# Patient Record
Sex: Female | Born: 1965 | Race: White | Hispanic: No | Marital: Married | Smoking: Never smoker
Health system: Southern US, Community
[De-identification: ages and names within clinical notes are randomized; demographics above are authoritative.]

## PROBLEM LIST (undated history)

## (undated) DIAGNOSIS — K635 Polyp of colon: Secondary | ICD-10-CM

## (undated) DIAGNOSIS — M549 Dorsalgia, unspecified: Secondary | ICD-10-CM

## (undated) DIAGNOSIS — K5792 Diverticulitis of intestine, part unspecified, without perforation or abscess without bleeding: Secondary | ICD-10-CM

## (undated) DIAGNOSIS — F419 Anxiety disorder, unspecified: Secondary | ICD-10-CM

## (undated) DIAGNOSIS — Q7962 Hypermobile Ehlers-Danlos syndrome: Secondary | ICD-10-CM

## (undated) DIAGNOSIS — G43909 Migraine, unspecified, not intractable, without status migrainosus: Secondary | ICD-10-CM

## (undated) DIAGNOSIS — K219 Gastro-esophageal reflux disease without esophagitis: Secondary | ICD-10-CM

## (undated) DIAGNOSIS — I10 Essential (primary) hypertension: Secondary | ICD-10-CM

## (undated) HISTORY — DX: Diverticulitis of intestine, part unspecified, without perforation or abscess without bleeding: K57.92

## (undated) HISTORY — DX: Gastro-esophageal reflux disease without esophagitis: K21.9

## (undated) HISTORY — DX: Polyp of colon: K63.5

---

## 1998-10-23 ENCOUNTER — Other Ambulatory Visit: Admission: RE | Admit: 1998-10-23 | Discharge: 1998-10-23 | Payer: Self-pay | Admitting: Obstetrics & Gynecology

## 2009-09-19 ENCOUNTER — Emergency Department (HOSPITAL_BASED_OUTPATIENT_CLINIC_OR_DEPARTMENT_OTHER): Admission: EM | Admit: 2009-09-19 | Discharge: 2009-09-19 | Payer: Self-pay | Admitting: Emergency Medicine

## 2010-11-05 LAB — POCT CARDIAC MARKERS
CKMB, poc: <1 ng/mL — ABNORMAL LOW (ref 1.0–8.0)
Myoglobin, poc: 25.5 ng/mL (ref 12–200)

## 2010-11-05 LAB — BASIC METABOLIC PANEL
BUN: 16 mg/dL (ref 6–23)
CO2: 31 mEq/L (ref 19–32)
Chloride: 102 mEq/L (ref 96–112)
Creatinine, Ser: 0.7 mg/dL (ref 0.4–1.2)
Glucose, Bld: 87 mg/dL (ref 70–99)

## 2010-11-05 LAB — CBC
HCT: 43.2 % (ref 36.0–46.0)
MCHC: 33.7 g/dL (ref 30.0–36.0)
MCV: 92.2 fL (ref 78.0–100.0)
Platelets: 206 10*3/uL (ref 150–400)
RDW: 12.3 % (ref 11.5–15.5)

## 2010-11-05 LAB — DIFFERENTIAL
Basophils Absolute: 0.1 10*3/uL (ref 0.0–0.1)
Basophils Relative: 1 % (ref 0–1)
Eosinophils Absolute: 0.3 10*3/uL (ref 0.0–0.7)
Eosinophils Relative: 5 % (ref 0–5)
Lymphs Abs: 1.7 10*3/uL (ref 0.7–4.0)

## 2010-11-05 LAB — D-DIMER, QUANTITATIVE

## 2013-09-20 ENCOUNTER — Ambulatory Visit: Payer: No Typology Code available for payment source | Attending: Internal Medicine

## 2013-10-16 ENCOUNTER — Other Ambulatory Visit: Payer: Self-pay

## 2013-10-16 ENCOUNTER — Emergency Department (HOSPITAL_COMMUNITY)
Admission: EM | Admit: 2013-10-16 | Discharge: 2013-10-16 | Disposition: A | Discharge status: Home / Self Care | Payer: No Typology Code available for payment source | Source: Home / Self Care

## 2013-10-16 ENCOUNTER — Encounter (HOSPITAL_COMMUNITY): Payer: Self-pay | Admitting: Emergency Medicine

## 2013-10-16 DIAGNOSIS — G8929 Other chronic pain: Secondary | ICD-10-CM

## 2013-10-16 DIAGNOSIS — I10 Essential (primary) hypertension: Secondary | ICD-10-CM

## 2013-10-16 DIAGNOSIS — Q796 Ehlers-Danlos syndrome, unspecified: Secondary | ICD-10-CM

## 2013-10-16 DIAGNOSIS — F419 Anxiety disorder, unspecified: Secondary | ICD-10-CM

## 2013-10-16 HISTORY — DX: Anxiety disorder, unspecified: F41.9

## 2013-10-16 HISTORY — DX: Dorsalgia, unspecified: M54.9

## 2013-10-16 HISTORY — DX: Hypermobile Ehlers-Danlos syndrome: Q79.62

## 2013-10-16 HISTORY — DX: Migraine, unspecified, not intractable, without status migrainosus: G43.909

## 2013-10-16 HISTORY — DX: Essential (primary) hypertension: I10

## 2013-10-16 MED ORDER — CLONIDINE HCL 0.1 MG PO TABS
0.1000 mg | ORAL_TABLET | Freq: Once | ORAL | Status: AC
  Administered 2013-10-16: 0.1 mg via ORAL

## 2013-10-16 MED ORDER — CLONIDINE HCL 0.1 MG PO TABS
ORAL_TABLET | ORAL | Status: AC
  Filled 2013-10-16: qty 1

## 2013-10-16 MED ORDER — ESOMEPRAZOLE MAGNESIUM 40 MG PO PACK: 40.0000 mg | PACK | Freq: Every day | ORAL | Status: DC

## 2013-10-16 MED ORDER — ATENOLOL 25 MG PO TABS: 25.0000 mg | ORAL_TABLET | Freq: Every day | ORAL | Status: DC

## 2013-10-16 NOTE — ED Provider Notes (Signed)
Medical screening examination/treatment/procedure(s) were performed by a resident physician and as supervising physician I was immediately available for consultation/collaboration.  Philipp Deputy, M.D.  Harden Mo, MD 10/16/13 [phone]

## 2013-10-16 NOTE — ED Provider Notes (Signed)
CSN: [id number]     Arrival date & time 10/16/13  1811 History   None    Chief Complaint  Patient presents with  . Chest Pain   (Consider location/radiation/quality/duration/timing/severity/associated sxs/prior Treatment) HPI  CP: current episode started Sunday night, but episodes off and on for last several months. Described more as chest tightness or shortness of breath. Occasionally associated w/ head tingling. Felt panicky. Associated w/ dizziness. Comes on while resting, walking, eating. Episodes last for a few hours. Occasional heart palpitation.  Dx w/ HTN 4 mo ago. Has not been taking atenolol as she lost her prescription. Not currently having symptoms. Vicodin w/ relief earlier today. Pt under significantly more stress lately given recent divorce, and financial struggles.   Past Medical History  Diagnosis Date  . Hypertension   . Ehlers-Danlos syndrome type III   . Anxiety   . Migraines   . Back pain    History reviewed. No pertinent past surgical history. No family history on file. History  Substance Use Topics  . Smoking status: Never Smoker   . Smokeless tobacco: Not on file  . Alcohol Use: 10.5 oz/week    21 drink(s) per week     Comment: occasionally    OB History   Grav Para Term Preterm Abortions TAB SAB Ect Mult Living                 Review of Systems  Constitutional: Negative for fever and fatigue.  Respiratory: Positive for chest tightness and shortness of breath.   Cardiovascular: Positive for palpitations. Negative for chest pain and leg swelling.  Neurological: Positive for light-headedness.  All other systems reviewed and are negative.    Allergies  Review of patient's allergies indicates no known allergies.  Home Medications   Current Outpatient Rx  Name  Route  Sig  Dispense  Refill  . acyclovir (ZOVIRAX) 800 MG tablet   Oral   Take 800 mg by mouth 5 (five) times daily.         Marland Kitchen HYDROcodone-acetaminophen (NORCO/VICODIN) 5-325 MG per  tablet   Oral   Take 1 tablet by mouth every 6 (six) hours as needed for moderate pain.         Marland Kitchen atenolol (TENORMIN) 25 MG tablet   Oral   Take 1 tablet (25 mg total) by mouth daily.   30 tablet   0    BP 167/104  There were no vitals taken for this visit. Physical Exam  Constitutional: She is oriented to person, place, and time. She appears well-developed and well-nourished. No distress.  HENT:  Head: Normocephalic and atraumatic.  Eyes: EOM are normal. Pupils are equal, round, and reactive to light.  Neck: Neck supple.  Cardiovascular: Normal rate.   II/VI systolic murmur  Pulmonary/Chest: Effort normal. No respiratory distress. She has no wheezes. She has no rales. She exhibits no tenderness.  Abdominal: Soft. She exhibits no distension.  Musculoskeletal: Normal range of motion. She exhibits no edema and no tenderness.  Neurological: She is alert and oriented to person, place, and time.  Skin: Skin is warm. No rash noted. She is not diaphoretic.    ED Course  Procedures (including critical care time) Labs Review Labs Reviewed - No data to display Imaging Review No results found.   MDM   1. Anxiety   2. HTN (hypertension)   3. Ehlers-Danlos disease   4. Chronic pain    48yo F w/ multiple complaints likely related in large part to  her anxiety. No evidence of ischemic myocardial event tonight. Pt does need to keep her pressure under control given her EDtypeIII.  - clonodine 0.1 given tonight - Atenolol refilled - discuss pain regimen w/ PCP including starting gabapentin and tramadol in order to wean off Vicodin - precautions given and all qeustions answered - start nexium for reflux  Linna Darner, MD Family Medicine PGY-3 10/16/2013, 7:44 PM   >52min in direct pt care adn counseling    Waldemar Dickens, MD 10/16/13 1944  Waldemar Dickens, MD 10/16/13 2007

## 2013-10-16 NOTE — ED Notes (Addendum)
Patient complains of chest pain and palpitations, shortness of breath, dizziness; states this has happened on and off for months; states that she took a vicodin and heart rate went down today. Also states tingling sensation in head.

## 2013-10-16 NOTE — Discharge Instructions (Signed)
There is no evidence of a heart attack or major heart abnormality tonight Please get in to see a physician at [facility] or other facility Please consider getting started on tramadol to and gabapentin to help wean you off the vicodin Consider discussing OIH (opioid induced hypersensitivity) with your physician Please start the atenolol for your blood pressure. It is very important that you control your blood pressure Please discuss other medications to help with your anxiety such as an lexapro, cymbalta, effexor or other medication.

## 2013-10-29 ENCOUNTER — Ambulatory Visit: Payer: No Typology Code available for payment source | Attending: Internal Medicine | Admitting: Internal Medicine

## 2013-10-29 ENCOUNTER — Encounter: Payer: Self-pay | Admitting: Internal Medicine

## 2013-10-29 VITALS — BP 138/86 | HR 73 | Temp 98.0°F | Resp 16 | Ht 67.5 in | Wt 136.4 lb

## 2013-10-29 DIAGNOSIS — I10 Essential (primary) hypertension: Secondary | ICD-10-CM

## 2013-10-29 DIAGNOSIS — K2970 Gastritis, unspecified, without bleeding: Secondary | ICD-10-CM

## 2013-10-29 DIAGNOSIS — Q7962 Hypermobile Ehlers-Danlos syndrome: Secondary | ICD-10-CM

## 2013-10-29 DIAGNOSIS — K2990 Gastroduodenitis, unspecified, without bleeding: Principal | ICD-10-CM

## 2013-10-29 DIAGNOSIS — Q796 Ehlers-Danlos syndrome, unspecified: Secondary | ICD-10-CM

## 2013-10-29 LAB — CBC WITH DIFFERENTIAL/PLATELET
BASOS ABS: 0 10*3/uL (ref 0.0–0.1)
BASOS PCT: 0 % (ref 0–1)
EOS ABS: 0.2 10*3/uL (ref 0.0–0.7)
EOS PCT: 2 % (ref 0–5)
HEMATOCRIT: 41 % (ref 36.0–46.0)
Hemoglobin: 14.1 g/dL (ref 12.0–15.0)
Lymphocytes Relative: 13 % (ref 12–46)
Lymphs Abs: 1.3 10*3/uL (ref 0.7–4.0)
MCH: 32 pg (ref 26.0–34.0)
MCHC: 34.4 g/dL (ref 30.0–36.0)
MCV: 93.2 fL (ref 78.0–100.0)
MONO ABS: 0.8 10*3/uL (ref 0.1–1.0)
Monocytes Relative: 8 % (ref 3–12)
NEUTROS ABS: 7.5 10*3/uL (ref 1.7–7.7)
Neutrophils Relative %: 77 % (ref 43–77)
Platelets: 242 10*3/uL (ref 150–400)
RBC: 4.4 MIL/uL (ref 3.87–5.11)
RDW: 13.1 % (ref 11.5–15.5)
WBC: 9.8 10*3/uL (ref 4.0–10.5)

## 2013-10-29 MED ORDER — TRAMADOL HCL 50 MG PO TABS: 50.0000 mg | ORAL_TABLET | Freq: Three times a day (TID) | ORAL | Status: DC | PRN

## 2013-10-29 MED ORDER — ESOMEPRAZOLE MAGNESIUM 40 MG PO PACK: 40.0000 mg | PACK | Freq: Every day | ORAL | Status: DC

## 2013-10-29 MED ORDER — GABAPENTIN 100 MG PO CAPS
100.0000 mg | ORAL_CAPSULE | Freq: Three times a day (TID) | ORAL | Status: DC
Start: 2013-10-29 — End: 2017-12-28

## 2013-10-29 MED ORDER — ATENOLOL 25 MG PO TABS: 25.0000 mg | ORAL_TABLET | Freq: Every day | ORAL | Status: DC

## 2013-10-29 NOTE — Progress Notes (Signed)
Here to establish care States she does have a problem with acid reflux She is currently taking nexium but feels as if it is not working  Stated that for a couple of days she was experiencing diarrhea  But as of yesterday she was not able to go States she was having dull, throbbing ache stomach pains She lay down for relief. States right now she is having burning feeling in stomach, bloating, and bowel problem  States she would like blood work today

## 2013-10-29 NOTE — Progress Notes (Signed)
Patient ID: Lydia [name], female   DOB: Dec 29, 1965, 48 y.o.   MRN: [id number]   CC:  HPI: 48 year old female here to establish care. The patient was recently seen in urgent care for hypertension. She has a history of ehlers danlos syndrome-type III confirmed by a geneticist in [facility]. Patient today primarily complains of heartburn and upset stomach. She has been taking Vicodin for her back pain prescribed by Dr. Nelva Bush, [facility] orthopedics. 4 chronic back pain She also complains of having hypertension blood pressure was elevated in the 160s when she went to urgent care. She was prescribed atenolol. She also states that she saw consult cardiology in [facility] and was told that she had a PFO.  She denies any chest pain any shortness of breath today. She states that she history of SVT in the past. The last time she had a 2-D echo she was diagnosed with a PFO. She also states that she has a history of hepatitis C. Her mother also had hepatitis C.  She complains of constipation secondary to Vicodin or vomiting with diarrhea and rectal impaction. Her mother died of breast cancer and colon cancer  Her father had ED syndrome. He had died suddenly when he was in his 73s. Patient is a nonsmoker nonalcoholic  She had a routine mammogram 2 years ago that was negative   No Known Allergies Past Medical History  Diagnosis Date  . Hypertension   . Ehlers-Danlos syndrome type III   . Anxiety   . Migraines   . Back pain    Current Outpatient Prescriptions on File Prior to Visit  Medication Sig Dispense Refill  . acyclovir (ZOVIRAX) 800 MG tablet Take 800 mg by mouth 5 (five) times daily.      Marland Kitchen esomeprazole (NEXIUM) 40 MG packet Take 40 mg by mouth daily before breakfast.  30 each  0  . HYDROcodone-acetaminophen (NORCO/VICODIN) 5-325 MG per tablet Take 1 tablet by mouth every 6 (six) hours as needed for moderate pain.       No current facility-administered medications on file prior to  visit.   No family history on file. History   Social History  . Marital Status: Married    Spouse Name: N/A    Number of Children: N/A  . Years of Education: N/A   Occupational History  . Not on file.   Social History Main Topics  . Smoking status: Never Smoker   . Smokeless tobacco: Not on file  . Alcohol Use: 10.5 oz/week    21 drink(s) per week     Comment: occasionally   . Drug Use: No  . Sexual Activity: Yes   Other Topics Concern  . Not on file   Social History Narrative  . No narrative on file    Review of Systems  Constitutional: Negative for fever, chills, diaphoresis, activity change, appetite change and fatigue.  HENT: Negative for ear pain, nosebleeds, congestion, facial swelling, rhinorrhea, neck pain, neck stiffness and ear discharge.   Eyes: Negative for pain, discharge, redness, itching and visual disturbance.  Respiratory: Negative for cough, choking, chest tightness, shortness of breath, wheezing and stridor.   Cardiovascular: Negative for chest pain, palpitations and leg swelling.  Gastrointestinal: Negative for abdominal distention.  Genitourinary: Negative for dysuria, urgency, frequency, hematuria, flank pain, decreased urine volume, difficulty urinating and dyspareunia.  Musculoskeletal: Negative for back pain, joint swelling, arthralgias and gait problem.  Neurological: Negative for dizziness, tremors, seizures, syncope, facial asymmetry, speech difficulty, weakness, light-headedness, numbness  and headaches.  Hematological: Negative for adenopathy. Does not bruise/bleed easily.  Psychiatric/Behavioral: Negative for hallucinations, behavioral problems, confusion, dysphoric mood, decreased concentration and agitation.    Objective:   Filed Vitals:   10/29/13 1147  BP: 138/86  Pulse: 73  Temp: 98 F (36.7 C)  Resp: 16    Physical Exam  Constitutional: Appears well-developed and well-nourished. No distress.  HENT: Normocephalic. External  right and left ear normal. Oropharynx is clear and moist.  Eyes: Conjunctivae and EOM are normal. PERRLA, no scleral icterus.  Neck: Normal ROM. Neck supple. No JVD. No tracheal deviation. No thyromegaly.  CVS: RRR, S1/S2 +, no murmurs, no gallops, no carotid bruit.  Pulmonary: Effort and breath sounds normal, no stridor, rhonchi, wheezes, rales.  Abdominal: As in history of present illness  Musculoskeletal: Normal range of motion. No edema and no tenderness.  Lymphadenopathy: No lymphadenopathy noted, cervical, inguinal. Neuro: Alert. Normal reflexes, muscle tone coordination. No cranial nerve deficit. Skin: Skin is warm and dry. No rash noted. Not diaphoretic. No erythema. No pallor.  Psychiatric: Normal mood and affect. Behavior, judgment, thought content normal.   Lab Results  Component Value Date   WBC 6.8 09/19/2009   HGB 14.6 09/19/2009   HCT 43.2 09/19/2009   MCV 92.2 09/19/2009   PLT 206 09/19/2009   Lab Results  Component Value Date   CREATININE .7 09/19/2009   BUN 16 09/19/2009   NA 142 09/19/2009   K 4.0 09/19/2009   CL 102 09/19/2009   CO2 31 09/19/2009    No results found for this basename: HGBA1C   Lipid Panel  No results found for this basename: chol, trig, hdl, cholhdl, vldl, ldlcalc       Assessment and plan:   There are no active problems to display for this patient.  Abdominal bloating The combination of gastritis secondary to NSAIDs, the patient does take ibuprofen on a regular basis versus irritable bowel syndrome versus diverticulosis Mother had diverticulosis and colon cancer She needs a screening colonoscopy She also needs to minimize narcotics She will start taking over-the-counter stool softener Gastroenterology referral is being provided She takes Nexium once a day, after discontinuing Motrin, she will increase Nexium to twice a day to see if this helps   Chronic back pain Followed by [facility] orthopedics Patient will be started on tramadol and gabapentin  for the same She is advised to minimize Tylenol, NSAIDs because of history of hepatitis C    Hepatitis C Check HIV, hepatitis B Hepatitis C viral load Gastroenterology referral       History of a pfo Will schedule patient for a bubble study Obtain records from consult cardiology  Establish care Obtain baseline labs Scheduled date in for Pap smear and mammogram  Follow up in 3 months  The patient was given clear instructions to go to ER or return to medical center if symptoms don't improve, worsen or new problems develop. The patient verbalized understanding. The patient was told to call to get any lab results if not heard anything in the next week.

## 2013-10-30 ENCOUNTER — Other Ambulatory Visit: Payer: Self-pay | Admitting: Internal Medicine

## 2013-10-30 DIAGNOSIS — Z1231 Encounter for screening mammogram for malignant neoplasm of breast: Secondary | ICD-10-CM

## 2013-10-30 DIAGNOSIS — Z803 Family history of malignant neoplasm of breast: Secondary | ICD-10-CM

## 2013-10-30 LAB — LIPID PANEL
CHOL/HDL RATIO: 2.3 ratio
CHOLESTEROL: 218 mg/dL — AB (ref 0–200)
HDL: 93 mg/dL (ref >39)
LDL Cholesterol: 103 mg/dL — ABNORMAL HIGH (ref 0–99)
Triglycerides: 112 mg/dL (ref <150)
VLDL: 22 mg/dL (ref 0–40)

## 2013-10-30 LAB — COMPLETE METABOLIC PANEL WITH GFR
ALK PHOS: 63 U/L (ref 39–117)
ALT: 12 U/L (ref 0–35)
AST: 17 U/L (ref 0–37)
Albumin: 4.8 g/dL (ref 3.5–5.2)
BILIRUBIN TOTAL: 0.4 mg/dL (ref 0.2–1.2)
BUN: 12 mg/dL (ref 6–23)
CO2: 25 mEq/L (ref 19–32)
CREATININE: 0.66 mg/dL (ref 0.50–1.10)
Calcium: 10.1 mg/dL (ref 8.4–10.5)
Chloride: 100 mEq/L (ref 96–112)
GFR, Est African American: >89 mL/min
GLUCOSE: 91 mg/dL (ref 70–99)
Potassium: 4.4 mEq/L (ref 3.5–5.3)
Sodium: 137 mEq/L (ref 135–145)
Total Protein: 7.3 g/dL (ref 6.0–8.3)

## 2013-10-30 LAB — VITAMIN D 25 HYDROXY (VIT D DEFICIENCY, FRACTURES): Vit D, 25-Hydroxy: 83 ng/mL (ref 30–89)

## 2013-10-30 LAB — HEPATITIS C RNA QUANTITATIVE: HCV QUANT: NOT DETECTED [IU]/mL (ref <15)

## 2013-10-30 LAB — HIV ANTIBODY (ROUTINE TESTING W REFLEX): HIV: NONREACTIVE

## 2013-10-30 LAB — TSH: TSH: 0.946 u[IU]/mL (ref 0.350–4.500)

## 2013-10-30 LAB — HEPATITIS B SURFACE ANTIBODY, QUANTITATIVE: HEPATITIS B-POST: 0.2 m[IU]/mL

## 2013-10-31 ENCOUNTER — Encounter: Payer: Self-pay | Admitting: Gastroenterology

## 2013-10-31 LAB — HEPATITIS B E ANTIGEN: HEPATITIS BE ANTIGEN: NONREACTIVE

## 2013-11-01 ENCOUNTER — Encounter: Payer: Self-pay | Admitting: Internal Medicine

## 2013-11-02 ENCOUNTER — Encounter: Payer: Self-pay | Admitting: Internal Medicine

## 2013-11-05 ENCOUNTER — Other Ambulatory Visit: Payer: Self-pay | Admitting: Emergency Medicine

## 2013-11-05 ENCOUNTER — Telehealth: Payer: Self-pay | Admitting: Emergency Medicine

## 2013-11-05 ENCOUNTER — Encounter: Payer: Self-pay | Admitting: Internal Medicine

## 2013-11-05 MED ORDER — ATENOLOL 25 MG PO TABS: 25.0000 mg | ORAL_TABLET | Freq: Every day | ORAL | Status: DC

## 2013-11-05 NOTE — Telephone Encounter (Signed)
Pt given negative results.

## 2013-11-05 NOTE — Telephone Encounter (Signed)
Left message for pt to call clinic for lab results 

## 2013-11-05 NOTE — Telephone Encounter (Signed)
Message copied by Ricci Barker on Mon Nov 05, 2013  5:50 PM ------      Message from: Allyson Sabal MD, [facility]      Created: Sat Nov 03, 2013 12:07 PM       Notify patient and all labs for hepatitis and vitamin D., TSH as well as CBC and CMP are negative ------

## 2013-11-06 ENCOUNTER — Encounter: Payer: Self-pay | Admitting: Gastroenterology

## 2013-11-06 ENCOUNTER — Ambulatory Visit (INDEPENDENT_AMBULATORY_CARE_PROVIDER_SITE_OTHER): Payer: No Typology Code available for payment source | Admitting: Gastroenterology

## 2013-11-06 VITALS — BP 144/76 | HR 90 | Ht 67.0 in | Wt 138.0 lb

## 2013-11-06 DIAGNOSIS — K5900 Constipation, unspecified: Secondary | ICD-10-CM

## 2013-11-06 DIAGNOSIS — K219 Gastro-esophageal reflux disease without esophagitis: Secondary | ICD-10-CM

## 2013-11-06 DIAGNOSIS — R1013 Epigastric pain: Secondary | ICD-10-CM

## 2013-11-06 DIAGNOSIS — Z800 Family history of malignant neoplasm of digestive organs: Secondary | ICD-10-CM

## 2013-11-06 MED ORDER — MOVIPREP 100 G PO SOLR: 1.0000 | Freq: Once | ORAL | Status: DC

## 2013-11-06 NOTE — Progress Notes (Signed)
11/06/2013 Lydia [name] [id number] 11-14-1965   HISTORY OF PRESENT ILLNESS:  This is a pleasant 48 year old female who presents to our office today for a few different issues. First, she states her mother was diagnosed with colon cancer at age 26. The patient herself has never undergone colonoscopy.  Second, the patient complains of constipation. She states that she has had GI issues all of her life. Currently she has been taking laxatives as needed for constipation, which then cause her to have a lot of abdominal cramping and diarrhea. In turn she then takes Imodium to help with that, which then starts her cycle all over again. She tried taking one stool softener daily, but obviously that did not make a difference in her constipation issues.  She does take chronic pain medication including Vicodin and Ultram.  She also complains of a lot of abdominal bloating, belching, burning in her epigastrium, and acid reflux as well. She was recently started on Nexium 40 mg daily and took that for a couple of weeks with minimal improvement in her symptoms. Now, she has been taking it twice a day for the past week or so and has noticed some improvement in her acid reflux symptoms. She also admits to some nausea, but no vomiting. She denies any weight loss. She denies any dark or bloody stools.   Past Medical History  Diagnosis Date  . Hypertension   . Ehlers-Danlos syndrome type III   . Anxiety   . Migraines   . Back pain    History reviewed. No pertinent past surgical history.  reports that she has never smoked. She does not have any smokeless tobacco history on file. She reports that she drinks about 10.5 ounces of alcohol per week. She reports that she does not use illicit drugs. family history includes Colon cancer (age of onset: 82) in her mother; Colon cancer (age of onset: 45) in her maternal uncle. No Known Allergies    Outpatient Encounter Prescriptions as of 11/06/2013  Medication Sig  .  acyclovir (ZOVIRAX) 800 MG tablet Take 800 mg by mouth 5 (five) times daily.  Marland Kitchen atenolol (TENORMIN) 25 MG tablet Take 1 tablet (25 mg total) by mouth daily.  Marland Kitchen esomeprazole (NEXIUM) 40 MG packet Take 40 mg by mouth daily before breakfast.  . HYDROcodone-acetaminophen (NORCO/VICODIN) 5-325 MG per tablet Take 1 tablet by mouth every 6 (six) hours as needed for moderate pain.  Marland Kitchen gabapentin (NEURONTIN) 100 MG capsule Take 1 capsule (100 mg total) by mouth 3 (three) times daily.  Marland Kitchen MOVIPREP 100 G SOLR Take 1 kit (200 g total) by mouth once.  . traMADol (ULTRAM) 50 MG tablet Take 1 tablet (50 mg total) by mouth every 8 (eight) hours as needed.     REVIEW OF SYSTEMS  : All other systems reviewed and negative except where noted in the History of Present Illness.   PHYSICAL EXAM: BP 144/76  Pulse 90  Ht '5\' 7"'  (1.702 m)  Wt 138 lb (62.596 kg)  BMI 21.61 kg/m2 General: Well developed white female in no acute distress Head: Normocephalic and atraumatic Eyes:  Sclerae anicteric, conjunctiva pink. Ears: Normal auditory acuity.  Lungs: Clear throughout to auscultation Heart: Regular rate and rhythm Abdomen: Soft, non-distended.  Normal bowel sounds.  Mild epigastric TTP without R/R/G. Rectal: Deferred.  Will be performed at the time of colonoscopy. Musculoskeletal: Symmetrical with no gross deformities  Skin: No lesions on visible extremities Extremities: No edema  Neurological: Alert oriented x 4, grossly  non-focal Psychological:  Alert and cooperative. Normal mood and affect  ASSESSMENT AND PLAN: -Family history of colon cancer:  Mother was diagnosed at age 47.  Will schedule colonoscopy.  The risks, benefits, and alternatives were discussed with the patient and she consents to proceed. -Constipation:  Suggested that she begin taking Miralax daily. -GERD/epigastric abdominal pain:  Will continue Nexium 40 mg BID for now since it has been helping.  Will schedule for EGD as well to rule our  ulcer disease, gastritis, esophagitis, etc.

## 2013-11-06 NOTE — Patient Instructions (Signed)
You have been scheduled for an endoscopy and colonoscopy with propofol with Dr. Henrene Pastor. Please follow the written instructions given to you at your visit today. Please pick up your prep at the pharmacy within the next 1-3 days. If you use inhalers (even only as needed), please bring them with you on the day of your procedure. Your physician has requested that you go to www.startemmi.com and enter the access code given to you at your visit today. This web site gives a general overview about your procedure. However, you should still follow specific instructions given to you by our office regarding your preparation for the procedure.  Please purchase Miralax over the counter and mix 17 grams in 8 ounces of water or juice to drink at bedtime as needed for constipation

## 2013-11-07 DIAGNOSIS — K219 Gastro-esophageal reflux disease without esophagitis: Secondary | ICD-10-CM | POA: Insufficient documentation

## 2013-11-07 DIAGNOSIS — Z800 Family history of malignant neoplasm of digestive organs: Secondary | ICD-10-CM | POA: Insufficient documentation

## 2013-11-07 DIAGNOSIS — K5900 Constipation, unspecified: Secondary | ICD-10-CM | POA: Insufficient documentation

## 2013-11-07 DIAGNOSIS — R1013 Epigastric pain: Secondary | ICD-10-CM | POA: Insufficient documentation

## 2013-11-07 NOTE — Progress Notes (Signed)
Agree with initial assessment and plans as outlined 

## 2013-11-28 ENCOUNTER — Ambulatory Visit (AMBULATORY_SURGERY_CENTER): Payer: No Typology Code available for payment source | Admitting: Internal Medicine

## 2013-11-28 ENCOUNTER — Encounter: Payer: Self-pay | Admitting: Internal Medicine

## 2013-11-28 VITALS — BP 119/88 | HR 78 | Temp 98.5°F | Resp 14 | Ht 67.0 in | Wt 138.0 lb

## 2013-11-28 DIAGNOSIS — Z800 Family history of malignant neoplasm of digestive organs: Secondary | ICD-10-CM

## 2013-11-28 DIAGNOSIS — K219 Gastro-esophageal reflux disease without esophagitis: Secondary | ICD-10-CM

## 2013-11-28 MED ORDER — SODIUM CHLORIDE 0.9 % IV SOLN: 500.0000 mL | INTRAVENOUS | Status: DC

## 2013-11-28 NOTE — Op Note (Signed)
[place]  [address]. [place], [address]   ENDOSCOPY PROCEDURE REPORT  PATIENT: Lydia [name], Lydia [name]  MR#: [id number] BIRTHDATE: [birth year] , 47  yrs. old GENDER: Female ENDOSCOPIST: Eustace Quail, MD REFERRED BY:  Lorayne Marek, MD PROCEDURE DATE:  11/28/2013 PROCEDURE:  EGD, diagnostic ASA CLASS:     Class II INDICATIONS:  History of esophageal reflux. MEDICATIONS: MAC sedation, administered by CRNA and propofol (Diprivan) 130mg  IV TOPICAL ANESTHETIC: none  DESCRIPTION OF PROCEDURE: After the risks benefits and alternatives of the procedure were thoroughly explained, informed consent was obtained.  The [id number] endoscope was introduced through the mouth and advanced to the second portion of the duodenum. Without limitations.  The instrument was slowly withdrawn as the mucosa was fully examined.    EXAM:The upper, middle and distal third of the esophagus were carefully inspected and no abnormalities were noted.  The z-line was well seen at the GEJ.  The endoscope was pushed into the fundus which was normal including a retroflexed view.  The antrum, gastric body, first and second part of the duodenum were unremarkable. Retroflexed views revealed no abnormalities.     The scope was then withdrawn from the patient and the procedure completed.  COMPLICATIONS: There were no complications. ENDOSCOPIC IMPRESSION: 1. Normal EGD 2. GERD  RECOMMENDATIONS: 1.  Anti-reflux regimen to be followed 2.  Continue Nexium daily as needed to control reflux symptoms  REPEAT EXAM:  eSigned:  Eustace Quail, MD 11/28/2013 2:58 PM   CC:The Patient  ; Lorayne Marek, MD

## 2013-11-28 NOTE — Patient Instructions (Signed)
YOU HAD AN ENDOSCOPIC PROCEDURE TODAY AT THE [facility]: Refer to the procedure report that was given to you for any specific questions about what was found during the examination.  If the procedure report does not answer your questions, please call your gastroenterologist to clarify.  If you requested that your care partner not be given the details of your procedure findings, then the procedure report has been included in a sealed envelope for you to review at your convenience later.  YOU SHOULD EXPECT: Some feelings of bloating in the abdomen. Passage of more gas than usual.  Walking can help get rid of the air that was put into your GI tract during the procedure and reduce the bloating. If you had a lower endoscopy (such as a colonoscopy or flexible sigmoidoscopy) you may notice spotting of blood in your stool or on the toilet paper. If you underwent a bowel prep for your procedure, then you may not have a normal bowel movement for a few days.  DIET: Your first meal following the procedure should be a light meal and then it is ok to progress to your normal diet.  A half-sandwich or bowl of soup is an example of a good first meal.  Heavy or fried foods are harder to digest and may make you feel nauseous or bloated.  Likewise meals heavy in dairy and vegetables can cause extra gas to form and this can also increase the bloating.  Drink plenty of fluids but you should avoid alcoholic beverages for 24 hours.  ACTIVITY: Your care partner should take you home directly after the procedure.  You should plan to take it easy, moving slowly for the rest of the day.  You can resume normal activity the day after the procedure however you should NOT DRIVE or use heavy machinery for 24 hours (because of the sedation medicines used during the test).    SYMPTOMS TO REPORT IMMEDIATELY: A gastroenterologist can be reached at any hour.  During normal business hours, 8:30 AM to 5:00 PM Monday through Friday,  call [phone].  After hours and on weekends, please call the GI answering service at [phone] who will take a message and have the physician on call contact you.   Following lower endoscopy (colonoscopy or flexible sigmoidoscopy):  Excessive amounts of blood in the stool  Significant tenderness or worsening of abdominal pains  Swelling of the abdomen that is new, acute  Fever of 100F or higher  Following upper endoscopy (EGD)  Vomiting of blood or coffee ground material  New chest pain or pain under the shoulder blades  Painful or persistently difficult swallowing  New shortness of breath  Fever of 100F or higher  Black, tarry-looking stools  FOLLOW UP: If any biopsies were taken you will be contacted by phone or by letter within the next 1-3 weeks.  Call your gastroenterologist if you have not heard about the biopsies in 3 weeks.  Our staff will call the home number listed on your records the next business day following your procedure to check on you and address any questions or concerns that you may have at that time regarding the information given to you following your procedure. This is a courtesy call and so if there is no answer at the home number and we have not heard from you through the emergency physician on call, we will assume that you have returned to your regular daily activities without incident.  SIGNATURES/CONFIDENTIALITY: You and/or your care   partner have signed paperwork which will be entered into your electronic medical record.  These signatures attest to the fact that that the information above on your After Visit Summary has been reviewed and is understood.  Full responsibility of the confidentiality of this discharge information lies with you and/or your care-partner.  Resume medications. Information given on diverticulosis and high fiber diet with discharge instructions.

## 2013-11-28 NOTE — Op Note (Signed)
[facility]  [address] [employer]. [place], [address]   COLONOSCOPY PROCEDURE REPORT  PATIENT: Lydia [name], Lydia [name]  MR#: [id number] BIRTHDATE: Feb 08, 1966 , 48  yrs. old GENDER: Female ENDOSCOPIST: Eustace Quail, MD REFERRED JG:[id number] Advani, M.D. PROCEDURE DATE:  11/28/2013 PROCEDURE:   Colonoscopy, screening First Screening Colonoscopy - Avg.  risk and is 46 yrs.  old or older - No.  Prior Negative Screening - Now for repeat screening. N/A  History of Adenoma - Now for follow-up colonoscopy & has been > or = to 3 yrs.  N/A  Polyps Removed Today? No.  Recommend repeat exam, <10 yrs? Yes.  High risk (family or personal hx). ASA CLASS:   Class II INDICATIONS:Patient's immediate family history of colon cancer(mother at age 31). MEDICATIONS: MAC sedation, administered by CRNA and propofol (Diprivan) 470mg  IV  DESCRIPTION OF PROCEDURE:   After the risks benefits and alternatives of the procedure were thoroughly explained, informed consent was obtained.  A digital rectal exam revealed no abnormalities of the rectum.   The [id number]  endoscope was introduced through the anus and advanced to the cecum, which was identified by both the appendix and ileocecal valve. No adverse events experienced.   The quality of the prep was excellent, using MoviPrep  The instrument was then slowly withdrawn as the colon was fully examined.  COLON FINDINGS: Mild diverticulosis was noted  in the right colon. The colon was otherwise normal.  There was no  inflammation, polyps or cancers unless previously stated.  Retroflexed views revealed no abnormalities. The time to cecum=3 minutes 19 seconds.  Withdrawal time=10 minutes 54 seconds.  The scope was withdrawn and the procedure completed. COMPLICATIONS: There were no complications.  ENDOSCOPIC IMPRESSION: 1.   Mild diverticulosis was noted in the right colon 2.   The colon was otherwise normal  RECOMMENDATIONS: 1.  Follow up  colonoscopy in 5 years (family history) 2.  Upper endoscopy today (see report)   eSigned:  Eustace Quail, MD 11/28/2013 2:54 PM   cc: The Patient    ; Lorayne Marek, MD

## 2013-11-28 NOTE — Progress Notes (Signed)
Report to pacu rn, vss, bbs=clear 

## 2013-11-29 ENCOUNTER — Telehealth: Payer: Self-pay | Admitting: *Deleted

## 2013-11-29 NOTE — Telephone Encounter (Signed)
Left message that we called for f/u 

## 2013-12-03 ENCOUNTER — Ambulatory Visit: Payer: No Typology Code available for payment source | Attending: Internal Medicine

## 2013-12-03 ENCOUNTER — Ambulatory Visit
Admission: RE | Admit: 2013-12-03 | Discharge: 2013-12-03 | Disposition: A | Discharge status: Home / Self Care | Payer: No Typology Code available for payment source | Source: Ambulatory Visit | Attending: Internal Medicine | Admitting: Internal Medicine

## 2013-12-03 DIAGNOSIS — Z1231 Encounter for screening mammogram for malignant neoplasm of breast: Secondary | ICD-10-CM

## 2013-12-03 DIAGNOSIS — Z803 Family history of malignant neoplasm of breast: Secondary | ICD-10-CM

## 2013-12-04 ENCOUNTER — Other Ambulatory Visit: Payer: Self-pay | Admitting: Internal Medicine

## 2013-12-04 MED ORDER — ESOMEPRAZOLE MAGNESIUM 40 MG PO CPDR: 40.0000 mg | DELAYED_RELEASE_CAPSULE | Freq: Every day | ORAL | Status: DC

## 2013-12-04 MED ORDER — ESOMEPRAZOLE MAGNESIUM 40 MG PO CPDR: 40.0000 mg | DELAYED_RELEASE_CAPSULE | Freq: Two times a day (BID) | ORAL | Status: DC

## 2014-01-09 ENCOUNTER — Ambulatory Visit: Payer: Self-pay | Attending: Internal Medicine | Admitting: Internal Medicine

## 2014-01-09 ENCOUNTER — Encounter: Payer: Self-pay | Admitting: Internal Medicine

## 2014-01-09 VITALS — BP 144/93 | HR 73 | Temp 97.8°F | Resp 16

## 2014-01-09 DIAGNOSIS — J069 Acute upper respiratory infection, unspecified: Secondary | ICD-10-CM | POA: Insufficient documentation

## 2014-01-09 DIAGNOSIS — F17200 Nicotine dependence, unspecified, uncomplicated: Secondary | ICD-10-CM | POA: Insufficient documentation

## 2014-01-09 DIAGNOSIS — J020 Streptococcal pharyngitis: Secondary | ICD-10-CM

## 2014-01-09 LAB — POCT RAPID STREP A (OFFICE): RAPID STREP A SCREEN: NEGATIVE

## 2014-01-09 MED ORDER — AMOXICILLIN-POT CLAVULANATE 875-125 MG PO TABS: 1.0000 | ORAL_TABLET | Freq: Two times a day (BID) | ORAL | Status: DC

## 2014-01-09 NOTE — Progress Notes (Signed)
Patient ID: Lydia [name], female   DOB: Aug 19, 1965, 48 y.o.   MRN: [id number] SORE THROAT  Sore throat began 3 weeks ago. Pain is: feels like knives in throat, constant Severity: moderate Medications tried: zyrtec, claritin, OTC cold medicine Strep throat exposure: yes, daughter  STD exposure: no   Symptoms Fever: no, chills Cough: yes Runny nose: yes, yellow mucous Muscle aches: yes Swollen Glands: yes throat Trouble breathing: no Drooling: no Weight loss: no  Patient believes could be caused by: allergies  Review of Symptoms - see HPI PMH - Smoking status noted.    Physical Exam  Vitals reviewed. Constitutional: She is oriented to [name], place, and time and well-developed, well-nourished, and in no distress.  HENT:  Right Ear: External ear normal.  Left Ear: External ear normal.  Eyes: Pupils are equal, round, and reactive to light.  Neck: Normal range of motion. Neck supple.  Cardiovascular: Normal rate, normal heart sounds and intact distal pulses.   Pulmonary/Chest: Effort normal and breath sounds normal.  Abdominal: Soft. Bowel sounds are normal. She exhibits no distension.  Musculoskeletal:  Knot on right trapezius muscle  Lymphadenopathy:    She has no cervical adenopathy.  Neurological: She is alert and oriented to [name], place, and time.  Skin: Skin is warm and dry. No rash noted. She is not diaphoretic.  Psychiatric: Judgment normal.   Lydia [name] was seen today for allergies.  Diagnoses and associated orders for this visit: Acute upper respiratory infections of unspecified site - Rapid Strep A - Discontinue: amoxicillin-clavulanate (AUGMENTIN) 875-125 MG per tablet; Take 1 tablet by mouth 2 (two) times daily. - amoxicillin-clavulanate (AUGMENTIN) 875-125 MG per tablet; Take 1 tablet by mouth 2 (two) times daily.  Follow up as needed, if problem persist or fail to improve.   Lance Bosch, NP 01/09/2014 3:56 PM

## 2014-01-09 NOTE — Progress Notes (Signed)
Patient here today complaining of ear pressure, sore throat, fatigued, and coughing. Patient states she is on Claritin and coughing some yellow sputum in the morning. Patient denies fever and chills. Patient states she has runny nose and sneezing. Alverda Skeans, RN

## 2014-01-09 NOTE — Patient Instructions (Signed)

## 2014-01-17 ENCOUNTER — Encounter: Payer: Self-pay | Admitting: Internal Medicine

## 2014-05-31 ENCOUNTER — Other Ambulatory Visit: Payer: Self-pay

## 2014-07-23 DIAGNOSIS — Z90710 Acquired absence of both cervix and uterus: Secondary | ICD-10-CM | POA: Insufficient documentation

## 2015-01-31 ENCOUNTER — Other Ambulatory Visit: Payer: Self-pay | Admitting: Physical Medicine and Rehabilitation

## 2015-01-31 DIAGNOSIS — M5441 Lumbago with sciatica, right side: Secondary | ICD-10-CM

## 2015-02-04 ENCOUNTER — Ambulatory Visit
Admission: RE | Admit: 2015-02-04 | Discharge: 2015-02-04 | Disposition: A | Discharge status: Home / Self Care | Payer: No Typology Code available for payment source | Source: Ambulatory Visit | Attending: Physical Medicine and Rehabilitation | Admitting: Physical Medicine and Rehabilitation

## 2015-02-04 ENCOUNTER — Other Ambulatory Visit: Payer: Self-pay

## 2015-02-04 DIAGNOSIS — M5441 Lumbago with sciatica, right side: Secondary | ICD-10-CM

## 2015-02-11 ENCOUNTER — Other Ambulatory Visit: Payer: Self-pay | Admitting: Neurology

## 2015-02-21 ENCOUNTER — Ambulatory Visit: Payer: Self-pay

## 2017-09-02 DIAGNOSIS — G8929 Other chronic pain: Secondary | ICD-10-CM | POA: Insufficient documentation

## 2017-09-02 DIAGNOSIS — M545 Low back pain, unspecified: Secondary | ICD-10-CM | POA: Insufficient documentation

## 2017-12-27 ENCOUNTER — Telehealth: Payer: Self-pay | Admitting: Internal Medicine

## 2017-12-27 NOTE — Telephone Encounter (Signed)
Pt states she is having issues with constipation, cramping and bloating. Reports she is taking senokot along with stool softeners but they do not seem to be helping. Pt takes Opiates daily for pain. Discussed with pt that opiates cause constipation. Pt scheduled to see  Alonza Bogus PA tomorrow at 3pm, pt aware of appt.

## 2017-12-28 ENCOUNTER — Ambulatory Visit (INDEPENDENT_AMBULATORY_CARE_PROVIDER_SITE_OTHER): Payer: Self-pay | Admitting: Gastroenterology

## 2017-12-28 ENCOUNTER — Ambulatory Visit (INDEPENDENT_AMBULATORY_CARE_PROVIDER_SITE_OTHER)
Admission: RE | Admit: 2017-12-28 | Discharge: 2017-12-28 | Disposition: A | Discharge status: Home / Self Care | Payer: No Typology Code available for payment source | Source: Ambulatory Visit | Attending: Gastroenterology | Admitting: Gastroenterology

## 2017-12-28 ENCOUNTER — Encounter: Payer: Self-pay | Admitting: Gastroenterology

## 2017-12-28 VITALS — BP 142/110 | HR 64 | Ht 67.0 in | Wt 141.6 lb

## 2017-12-28 DIAGNOSIS — K5909 Other constipation: Secondary | ICD-10-CM

## 2017-12-28 MED ORDER — LUBIPROSTONE 24 MCG PO CAPS: 24.0000 ug | ORAL_CAPSULE | Freq: Two times a day (BID) | ORAL | 3 refills | Status: DC

## 2017-12-28 NOTE — Patient Instructions (Addendum)
Amitiza 24 mcg twice a day with food.  Use one or two fleet enemas tonight then wait 30 minutes and drink 2 bottles of mag citrate.  Your provider has requested that you have an abdominal x ray before leaving today. Please go to the basement floor to our Radiology department for the test.

## 2017-12-28 NOTE — Progress Notes (Signed)
12/28/2017 Lydia [name] [id number] 04-29-1966   HISTORY OF PRESENT ILLNESS:  This is a 52 year old female who is a patient of Dr. Blanch Media.  She has family history of colon cancer in her mother so had colonoscopy in 11/2013 that showed mild diverticulosis in the right colon.  She has chronic constipation that has been worsened by use of pain medication.  Is here today to discuss further treatment of her constipation.  Has taken Miralax in the past but not for some time.  Uses dulcolax or senokot, etc but that causes a lot of cramping.  Stool softeners do not work well.  Abdomen gets very bloated and uncomfortable.   Past Medical History:  Diagnosis Date  . Anxiety   . Back pain   . Ehlers-Danlos syndrome type III   . Hypertension   . Migraines    History reviewed. No pertinent surgical history.  reports that she has never smoked. She does not have any smokeless tobacco history on file. She reports that she drinks about 10.5 oz of alcohol per week. She reports that she does not use drugs. family history includes Colon cancer (age of onset: 77) in her mother; Colon cancer (age of onset: 79) in her maternal uncle. No Known Allergies    Outpatient Encounter Medications as of 12/28/2017  Medication Sig  . acyclovir (ZOVIRAX) 800 MG tablet Take 800 mg by mouth 5 (five) times daily.  Marland Kitchen HYDROcodone-acetaminophen (NORCO/VICODIN) 5-325 MG per tablet Take 1 tablet by mouth every 6 (six) hours as needed for moderate pain.  . traMADol (ULTRAM) 50 MG tablet Take 1 tablet (50 mg total) by mouth every 8 (eight) hours as needed.  . [DISCONTINUED] amoxicillin-clavulanate (AUGMENTIN) 875-125 MG per tablet Take 1 tablet by mouth 2 (two) times daily. (Patient not taking: Reported on 12/28/2017)  . [DISCONTINUED] atenolol (TENORMIN) 25 MG tablet Take 1 tablet (25 mg total) by mouth daily. (Patient not taking: Reported on 12/28/2017)  . [DISCONTINUED] esomeprazole (NEXIUM) 40 MG capsule Take 1 capsule (40 mg  total) by mouth 2 (two) times daily before a meal. (Patient not taking: Reported on 12/28/2017)  . [DISCONTINUED] gabapentin (NEURONTIN) 100 MG capsule Take 1 capsule (100 mg total) by mouth 3 (three) times daily. (Patient not taking: Reported on 12/28/2017)   No facility-administered encounter medications on file as of 12/28/2017.      REVIEW OF SYSTEMS  : All other systems reviewed and negative except where noted in the History of Present Illness.   PHYSICAL EXAM: BP (!) 142/110   Pulse 64   Ht 5\' 7"  (1.702 m)   Wt 141 lb 9.6 oz (64.2 kg)   SpO2 97%   BMI 22.18 kg/m  General: Well developed white female in no acute distress; anxious Head: Normocephalic and atraumatic Eyes:  Sclerae anicteric, conjunctiva pink. Ears: Normal auditory acuity Lungs: Clear throughout to auscultation; no increased WOB. Heart: Regular rate and rhythm; no M/R/G. Abdomen: Soft, non-distended.  BS present.  Non-tender. Musculoskeletal: Symmetrical with no gross deformities  Skin: No lesions on visible extremities Extremities: No edema  Neurological: Alert oriented x 4, grossly non-focal Psychological:  Alert and cooperative. Normal mood and affect  ASSESSMENT AND PLAN: *Chronic constipation, likely medication induced:  I am going to send her for an abdominal x-ray to evaluate for fecal impaction, large stool burden.  Will have her do an enema tonight followed by 1-2 bottles of magnesium citrate to clean her out.  Then will have her start  Amitza 24 mcg BID with food (can decrease dose if needed)-samples given for now.  She will call back with an update on symptoms in 1-2 weeks.  CC:  No ref. provider found

## 2018-01-04 ENCOUNTER — Encounter: Payer: Self-pay | Admitting: Gastroenterology

## 2018-01-04 DIAGNOSIS — K5909 Other constipation: Secondary | ICD-10-CM | POA: Insufficient documentation

## 2018-01-04 NOTE — Progress Notes (Signed)
Assessment and plan reviewed 

## 2018-10-18 ENCOUNTER — Other Ambulatory Visit: Payer: Self-pay | Admitting: Physical Medicine and Rehabilitation

## 2018-10-18 DIAGNOSIS — M546 Pain in thoracic spine: Secondary | ICD-10-CM

## 2018-10-18 DIAGNOSIS — M542 Cervicalgia: Secondary | ICD-10-CM

## 2018-12-25 ENCOUNTER — Encounter: Payer: Self-pay | Admitting: Internal Medicine

## 2019-02-06 ENCOUNTER — Ambulatory Visit: Payer: Self-pay | Admitting: Internal Medicine

## 2019-06-08 DIAGNOSIS — M25562 Pain in left knee: Secondary | ICD-10-CM | POA: Insufficient documentation

## 2019-07-09 ENCOUNTER — Encounter: Payer: Self-pay | Admitting: Internal Medicine

## 2019-07-09 ENCOUNTER — Ambulatory Visit: Payer: BC Managed Care – PPO | Admitting: Internal Medicine

## 2019-07-09 VITALS — BP 122/80 | HR 72 | Temp 97.8°F | Ht 67.0 in | Wt 157.0 lb

## 2019-07-09 DIAGNOSIS — Z800 Family history of malignant neoplasm of digestive organs: Secondary | ICD-10-CM

## 2019-07-09 DIAGNOSIS — R140 Abdominal distension (gaseous): Secondary | ICD-10-CM

## 2019-07-09 DIAGNOSIS — K5909 Other constipation: Secondary | ICD-10-CM

## 2019-07-09 DIAGNOSIS — K219 Gastro-esophageal reflux disease without esophagitis: Secondary | ICD-10-CM | POA: Diagnosis not present

## 2019-07-09 DIAGNOSIS — Z1211 Encounter for screening for malignant neoplasm of colon: Secondary | ICD-10-CM

## 2019-07-09 DIAGNOSIS — Z1159 Encounter for screening for other viral diseases: Secondary | ICD-10-CM

## 2019-07-09 MED ORDER — NA SULFATE-K SULFATE-MG SULF 17.5-3.13-1.6 GM/177ML PO SOLN: 1.0000 | Freq: Once | ORAL | 0 refills | Status: AC

## 2019-07-09 MED ORDER — METOCLOPRAMIDE HCL 10 MG PO TABS: ORAL_TABLET | ORAL | 0 refills | Status: DC

## 2019-07-09 MED ORDER — PANTOPRAZOLE SODIUM 40 MG PO TBEC: 40.0000 mg | DELAYED_RELEASE_TABLET | Freq: Every day | ORAL | 3 refills | Status: DC

## 2019-07-09 NOTE — Patient Instructions (Addendum)
You have been scheduled for an endoscopy and colonoscopy. Please follow the written instructions given to you at your visit today. Please pick up your prep supplies at the pharmacy within the next 1-3 days. If you use inhalers (even only as needed), please bring them with you on the day of your procedure.  We have sent the following medications to your pharmacy for you to pick up at your convenience: Pantoprazole;  Reglan - take one tablet 20 minutes before drinking each half of your prep for nausea.  You may take MIralax daily for constipation  I have tried to star and underline important sections, dates, and times.  Please call me if you need anything at all clarified.

## 2019-07-09 NOTE — Progress Notes (Signed)
HISTORY OF PRESENT ILLNESS:  Lydia [name] is a 53 y.o. female with past medical history as listed below including Ehlers-Danlos syndrome, migraine headaches, anxiety, and chronic back pain for which she is on narcotics.  Patient presents today with ongoing complaints of bowel irregularities, active reflux symptoms, the need for follow-up screening colonoscopy, and requesting upper endoscopy.  Patient was last seen in this office by the GI physician assistant Dec 28, 2017 regarding chronic constipation.  She was started on Amitiza but is no longer taking the medication.  She is not sure if she took it, or if it worked.  Her biggest complaint today is progressive issues with bloating and weight gain.  She constantly feels full.  No vomiting.  She takes combination of stool softeners and Senokot to keep her bowels moving.  She does have a bowel movement most days.  Intermittently she will be troubled with severe postprandial cramping followed by urgency and diarrhea.  She also has active reflux symptoms.  She has been on PPI previously with good control of symptoms.  She tells me that she heard that PPIs "are not good for you", thus she stopped the medication.  No dysphagia.  Her mother had a history of colon cancer at age 74.  Patient did undergo routine screening colonoscopy November 28, 2013.  The examination was normal except for mild right-sided diverticulosis.  Follow-up in 5 years recommended.  Because of active reflux symptoms, she also underwent upper endoscopy at that time.  Examination was normal.  Review of outside x-rays from May 2019 shows negative abdominal films.  No relevant blood work for review.  She is accompanied today by her husband  REVIEW OF SYSTEMS:  All non-GI ROS negative unless otherwise stated in the HPI except for headaches, knee pain  Past Medical History:  Diagnosis Date  . Anxiety   . Back pain   . Ehlers-Danlos syndrome type III   . Hypertension   . Migraines      History reviewed. No pertinent surgical history.  Social History Lydia [name]  reports that she has never smoked. She has never used smokeless tobacco. She reports current alcohol use of about 21.0 standard drinks of alcohol per week. She reports that she does not use drugs.  family history includes Colon cancer (age of onset: 83) in her mother; Colon cancer (age of onset: 78) in her maternal uncle; Heart disease in her father.  Allergies  Allergen Reactions  . Latex Other (See Comments)       PHYSICAL EXAMINATION: Vital signs: BP 122/80   Pulse 72   Temp 97.8 F (36.6 C)   Ht 5\' 7"  (1.702 m)   Wt 157 lb (71.2 kg)   BMI 24.59 kg/m   Constitutional: generally well-appearing, no acute distress Psychiatric: alert and oriented x3, cooperative Eyes: extraocular movements intact, anicteric, conjunctiva pink Mouth: oral pharynx moist, no lesions Neck: supple no lymphadenopathy Cardiovascular: heart regular rate and rhythm, no murmur Lungs: clear to auscultation bilaterally Abdomen: soft, nontender, nondistended, no obvious ascites, no peritoneal signs, normal bowel sounds, no organomegaly Rectal: Deferred until colonoscopy Extremities: no clubbing, cyanosis, or lower extremity edema bilaterally Skin: no lesions on visible extremities Neuro: No focal deficits.  Cranial nerves intact  ASSESSMENT:  1.  Narcotic induced constipation 2.  Intermittent problems with cramping urgency and loose stools.  May be laxative effect 3.  GERD.  Active symptoms off PPI 4.  Family history of colon cancer.  Due for follow-up screening 5.  General medical problems.  Stable   PLAN:  1.  Reflux precautions 2.  Prescribe pantoprazole 40 mg daily.  Medication effects, side effects, and risks reviewed 3.  Schedule upper endoscopy to evaluate progressive abdominal bloating discomfort and active reflux symptoms.The nature of the procedure, as well as the risks, benefits, and alternatives  were carefully and thoroughly reviewed with the patient. Ample time for discussion and questions allowed. The patient understood, was satisfied, and agreed to proceed. 4.  Schedule colonoscopy for colon cancer screening given her family history.The nature of the procedure, as well as the risks, benefits, and alternatives were carefully and thoroughly reviewed with the patient. Ample time for discussion and questions allowed. The patient understood, was satisfied, and agreed to proceed. 5.  Recommended MiraLAX daily for chronic constipation.  Titrate to need 6.  Patient had concerns over the bowel preparation.  We have prescribed metoclopramide 10 mg to be taken 20 minutes before each prep session.  Also recommended hard candy 7.  Patient wishes to have the examination performed before the end of the year.  We will try to accommodate this need 8.  The patient's husband request being a patient of this practice.  He is welcome 40 minutes spent face-to-face with the patient.  Greater than 50% of the time used for counseling regarding her chronic abdominal complaints, irregular bowel habits, GERD, and the need for endoscopic procedures and addressing associated prep concerns.

## 2019-08-03 ENCOUNTER — Ambulatory Visit (INDEPENDENT_AMBULATORY_CARE_PROVIDER_SITE_OTHER): Payer: BC Managed Care – PPO

## 2019-08-03 ENCOUNTER — Encounter: Payer: Self-pay | Admitting: Internal Medicine

## 2019-08-03 ENCOUNTER — Other Ambulatory Visit: Payer: Self-pay | Admitting: Internal Medicine

## 2019-08-03 DIAGNOSIS — Z1159 Encounter for screening for other viral diseases: Secondary | ICD-10-CM

## 2019-08-06 LAB — SARS CORONAVIRUS 2 (TAT 6-24 HRS): SARS Coronavirus 2: NEGATIVE

## 2019-08-07 ENCOUNTER — Other Ambulatory Visit: Payer: Self-pay

## 2019-08-07 ENCOUNTER — Encounter: Payer: Self-pay | Admitting: Internal Medicine

## 2019-08-07 ENCOUNTER — Ambulatory Visit (AMBULATORY_SURGERY_CENTER): Payer: BC Managed Care – PPO | Admitting: Internal Medicine

## 2019-08-07 VITALS — BP 126/91 | HR 69 | Temp 98.1°F | Resp 14 | Ht 67.0 in | Wt 157.0 lb

## 2019-08-07 DIAGNOSIS — R140 Abdominal distension (gaseous): Secondary | ICD-10-CM

## 2019-08-07 DIAGNOSIS — D122 Benign neoplasm of ascending colon: Secondary | ICD-10-CM

## 2019-08-07 DIAGNOSIS — R1013 Epigastric pain: Secondary | ICD-10-CM | POA: Diagnosis not present

## 2019-08-07 DIAGNOSIS — K5900 Constipation, unspecified: Secondary | ICD-10-CM | POA: Diagnosis not present

## 2019-08-07 DIAGNOSIS — K219 Gastro-esophageal reflux disease without esophagitis: Secondary | ICD-10-CM

## 2019-08-07 DIAGNOSIS — D120 Benign neoplasm of cecum: Secondary | ICD-10-CM

## 2019-08-07 DIAGNOSIS — Z1211 Encounter for screening for malignant neoplasm of colon: Secondary | ICD-10-CM

## 2019-08-07 MED ORDER — SODIUM CHLORIDE 0.9 % IV SOLN: 500.0000 mL | Freq: Once | INTRAVENOUS | Status: DC

## 2019-08-07 NOTE — Progress Notes (Signed)
Report to PACU, RN, vss, BBS= Clear.  

## 2019-08-07 NOTE — Op Note (Signed)
[facility] Patient Name: Lydia [name] Procedure Date: 08/07/2019 1:41 PM MRN: [id number] Endoscopist: Docia Chuck. Henrene Pastor , MD Age: 53 Referring MD:  Date of Birth: 17-Oct-1965 Gender: Female Account #: [id number] Procedure:                Upper GI endoscopy with biopsies Indications:              Dyspepsia, Esophageal reflux, Abdominal bloating Medicines:                Monitored Anesthesia Care Procedure:                Pre-Anesthesia Assessment:                           - Prior to the procedure, a History and Physical                            was performed, and patient medications and                            allergies were reviewed. The patient's tolerance of                            previous anesthesia was also reviewed. The risks                            and benefits of the procedure and the sedation                            options and risks were discussed with the patient.                            All questions were answered, and informed consent                            was obtained. Prior Anticoagulants: The patient has                            taken no previous anticoagulant or antiplatelet                            agents. ASA Grade Assessment: II - A patient with                            mild systemic disease. After reviewing the risks                            and benefits, the patient was deemed in                            satisfactory condition to undergo the procedure.                           After obtaining informed consent, the endoscope was  passed under direct vision. Throughout the                            procedure, the patient's blood pressure, pulse, and                            oxygen saturations were monitored continuously. The                            Endoscope was introduced through the mouth, and                            advanced to the third part of duodenum. The upper       GI endoscopy was accomplished without difficulty.                            The patient tolerated the procedure well. Scope In: Scope Out: Findings:                 The esophagus was normal.                           The stomach was normal.                           The examined duodenum was normal. Biopsies for                            histology were taken with a cold forceps for                            evaluation of celiac disease.                           The cardia and gastric fundus were normal on                            retroflexion. Complications:            No immediate complications. Estimated Blood Loss:     Estimated blood loss: none. Impression:               - Normal esophagus.                           - Normal stomach.                           - Normal examined duodenum. Biopsied. Recommendation:           - Patient has a contact number available for                            emergencies. The signs and symptoms of potential                            delayed complications were discussed with the  patient. Return to normal activities tomorrow.                            Written discharge instructions were provided to the                            patient.                           - Resume previous diet.                           - Continue present medications.                           - Await pathology results. Docia Chuck. Henrene Pastor, MD 08/07/2019 2:13:27 PM This report has been signed electronically.

## 2019-08-07 NOTE — Patient Instructions (Signed)
Handout on polyps, and diverticulosis given. Use Miralax as needed for constipation.   YOU HAD AN ENDOSCOPIC PROCEDURE TODAY AT [facility]:   Refer to the procedure report that was given to you for any specific questions about what was found during the examination.  If the procedure report does not answer your questions, please call your gastroenterologist to clarify.  If you requested that your care partner not be given the details of your procedure findings, then the procedure report has been included in a sealed envelope for you to review at your convenience later.  YOU SHOULD EXPECT: Some feelings of bloating in the abdomen. Passage of more gas than usual.  Walking can help get rid of the air that was put into your GI tract during the procedure and reduce the bloating. If you had a lower endoscopy (such as a colonoscopy or flexible sigmoidoscopy) you may notice spotting of blood in your stool or on the toilet paper. If you underwent a bowel prep for your procedure, you may not have a normal bowel movement for a few days.  Please Note:  You might notice some irritation and congestion in your nose or some drainage.  This is from the oxygen used during your procedure.  There is no need for concern and it should clear up in a day or so.  SYMPTOMS TO REPORT IMMEDIATELY:   Following lower endoscopy (colonoscopy or flexible sigmoidoscopy):  Excessive amounts of blood in the stool  Significant tenderness or worsening of abdominal pains  Swelling of the abdomen that is new, acute  Fever of 100F or higher   Following upper endoscopy (EGD)  Vomiting of blood or coffee ground material  New chest pain or pain under the shoulder blades  Painful or persistently difficult swallowing  New shortness of breath  Fever of 100F or higher  Black, tarry-looking stools  For urgent or emergent issues, a gastroenterologist can be reached at any hour by calling [phone].   DIET:  We  do recommend a small meal at first, but then you may proceed to your regular diet.  Drink plenty of fluids but you should avoid alcoholic beverages for 24 hours.  ACTIVITY:  You should plan to take it easy for the rest of today and you should NOT DRIVE or use heavy machinery until tomorrow (because of the sedation medicines used during the test).    FOLLOW UP: Our staff will call the number listed on your records 48-72 hours following your procedure to check on you and address any questions or concerns that you may have regarding the information given to you following your procedure. If we do not reach you, we will leave a message.  We will attempt to reach you two times.  During this call, we will ask if you have developed any symptoms of COVID 19. If you develop any symptoms (ie: fever, flu-like symptoms, shortness of breath, cough etc.) before then, please call [phone].  If you test positive for Covid 19 in the 2 weeks post procedure, please call and report this information to [place].    If any biopsies were taken you will be contacted by phone or by letter within the next 1-3 weeks.  Please call us at [phone] if you have not heard about the biopsies in 3 weeks.    SIGNATURES/CONFIDENTIALITY: You and/or your care partner have signed paperwork which will be entered into your electronic medical record.  These signatures attest to the fact that  that the information above on your After Visit Summary has been reviewed and is understood.  Full responsibility of the confidentiality of this discharge information lies with you and/or your care-partner.

## 2019-08-07 NOTE — Op Note (Signed)
[facility] Patient Name: Lydia [name] Procedure Date: 08/07/2019 1:42 PM MRN: [id number] Endoscopist: Docia Chuck. Henrene Pastor , MD Age: 53 Referring MD:  Date of Birth: [birth year] Gender: Female Account #: [id number] Procedure:                Colonoscopy with cold snare polypectomy x 2 Indications:              Screening in patient at increased risk: Colorectal                            cancer in mother before age 24, Incidental                            abdominal bloating noted, Incidental constipation                            noted. Previous examination April 2015 (negative                            for neoplasia) Medicines:                Monitored Anesthesia Care Procedure:                Pre-Anesthesia Assessment:                           - Prior to the procedure, a History and Physical                            was performed, and patient medications and                            allergies were reviewed. The patient's tolerance of                            previous anesthesia was also reviewed. The risks                            and benefits of the procedure and the sedation                            options and risks were discussed with the patient.                            All questions were answered, and informed consent                            was obtained. Prior Anticoagulants: The patient has                            taken no previous anticoagulant or antiplatelet                            agents. ASA Grade Assessment: II - A patient with  mild systemic disease. After reviewing the risks                            and benefits, the patient was deemed in                            satisfactory condition to undergo the procedure.                           After obtaining informed consent, the colonoscope                            was passed under direct vision. Throughout the                            procedure, the patient's  blood pressure, pulse, and                            oxygen saturations were monitored continuously. The                            Colonoscope was introduced through the anus and                            advanced to the the cecum, identified by                            appendiceal orifice and ileocecal valve. The                            terminal ileum, ileocecal valve, appendiceal                            orifice, and rectum were photographed. The quality                            of the bowel preparation was excellent. The                            colonoscopy was performed without difficulty. The                            patient tolerated the procedure well. The bowel                            preparation used was SUPREP via split dose                            instruction. Scope In: 1:45:49 PM Scope Out: 1:59:33 PM Scope Withdrawal Time: 0 hours 10 minutes 36 seconds  Total Procedure Duration: 0 hours 13 minutes 44 seconds  Findings:                 The terminal ileum appeared normal.  A diffuse area of mild melanosis was found in the                            entire colon.                           Two polyps were found in the ascending colon and                            cecum. The polyps were 1 to 3 mm in size. These                            polyps were removed with a cold snare. Resection                            and retrieval were complete.                           A few small-mouthed diverticula were found in the                            sigmoid colon and ascending colon.                           The exam was otherwise without abnormality on                            direct and retroflexion views. Complications:            No immediate complications. Estimated blood loss:                            None. Estimated Blood Loss:     Estimated blood loss: none. Impression:               - The examined portion of the ileum was  normal.                           - Melanosis in the colon.                           - Two 1 to 3 mm polyps in the ascending colon and                            in the cecum, removed with a cold snare. Resected                            and retrieved.                           - Diverticulosis in the sigmoid colon and in the                            ascending colon.                           -  The examination was otherwise normal on direct                            and retroflexion views. Recommendation:           - Repeat colonoscopy in 5 years for surveillance.                           - Patient has a contact number available for                            emergencies. The signs and symptoms of potential                            delayed complications were discussed with the                            patient. Return to normal activities tomorrow.                            Written discharge instructions were provided to the                            patient.                           - Resume previous diet.                           - Continue present medications. MiraLAX as needed                            for constipation.                           - Await pathology results. Docia Chuck. Henrene Pastor, MD 08/07/2019 2:05:10 PM This report has been signed electronically.

## 2019-08-07 NOTE — Progress Notes (Signed)
Temp by JB, VS by CW 

## 2019-08-09 ENCOUNTER — Telehealth: Payer: Self-pay | Admitting: *Deleted

## 2019-08-09 NOTE — Telephone Encounter (Signed)
Follow up call made, left message. 

## 2019-08-20 ENCOUNTER — Encounter: Payer: Self-pay | Admitting: Internal Medicine

## 2020-02-11 DIAGNOSIS — M84361A Stress fracture, right tibia, initial encounter for fracture: Secondary | ICD-10-CM | POA: Insufficient documentation

## 2020-03-28 ENCOUNTER — Encounter: Payer: Self-pay | Admitting: Family Medicine

## 2020-03-28 ENCOUNTER — Other Ambulatory Visit: Payer: Self-pay

## 2020-03-28 ENCOUNTER — Ambulatory Visit: Payer: BC Managed Care – PPO | Admitting: Family Medicine

## 2020-03-28 VITALS — BP 120/80 | HR 72 | Temp 98.1°F | Ht 67.0 in | Wt 150.5 lb

## 2020-03-28 DIAGNOSIS — G43809 Other migraine, not intractable, without status migrainosus: Secondary | ICD-10-CM | POA: Diagnosis not present

## 2020-03-28 DIAGNOSIS — R030 Elevated blood-pressure reading, without diagnosis of hypertension: Secondary | ICD-10-CM

## 2020-03-28 DIAGNOSIS — Z13220 Encounter for screening for lipoid disorders: Secondary | ICD-10-CM

## 2020-03-28 DIAGNOSIS — E2839 Other primary ovarian failure: Secondary | ICD-10-CM | POA: Diagnosis not present

## 2020-03-28 DIAGNOSIS — Z8249 Family history of ischemic heart disease and other diseases of the circulatory system: Secondary | ICD-10-CM

## 2020-03-28 DIAGNOSIS — Z1231 Encounter for screening mammogram for malignant neoplasm of breast: Secondary | ICD-10-CM

## 2020-03-28 DIAGNOSIS — T07XXXA Unspecified multiple injuries, initial encounter: Secondary | ICD-10-CM

## 2020-03-28 DIAGNOSIS — R5383 Other fatigue: Secondary | ICD-10-CM

## 2020-03-28 MED ORDER — SUMATRIPTAN SUCCINATE 6 MG/0.5ML ~~LOC~~ SOSY: 6.0000 mg | PREFILLED_SYRINGE | Freq: Once | SUBCUTANEOUS | 5 refills | Status: DC

## 2020-03-28 NOTE — Progress Notes (Signed)
Lydia [name] DOB: [birth year] Encounter date: 03/28/2020  This isa 54 y.o. female who presents to establish care. Chief Complaint  Patient presents with  . Establish Care    History of present illness: Needs family doc. Has had a lot of fractures in last few years (5), so orthopedic PA was worried she should take Vitamin D/Calcium. Wondering what vitamin D level is.   Has ehlers danlos and so has a lot of wear and tear. Hasn't done much to cause fracture. First fracture was with playing basketball with daughter - she was standing on foot when she went down. But then 8 mo later slipped on a little water and went down on left knee/foot and fractured 2 metatarsals left foot. Left knee last Oct/Nov was sitting cross leg and got up and then 30 min later knee started hurting and ended up having swelling in knee - had torn meniscus and stress fracture.   Went to [facility] and started estradiol patch, prog at night. Hot flashes got better. More difficulty with losing weight on hormone. Has lost 15lb on optivia. Seemed like if she would lose weight she would lose more if she kept patch off. Now she is dealing with hot flashes because of stopping hormones.   Wanted to get basic labs to check cholesterol.    Hypertension: not on medication. Usually runs higher. Surprised by low reading today. Usually 150-165/90-100. Tends to be cold and tired with bp medication. Trying to live stress free.   Migraines:has always had migraines; used to be worse with periods. Used to see neurology. Was on sumatriptan in past. Used to get aura - right eye, numbness right hand and took med and didn't get headache to follow. Now now getting aura - so by time getting migraine - taking longer to get rid of headache. Can get 2 in 1 month and can go without for awhile. Thinks on consideration that with estrogen she was getting migraines more.   Does take aleve pretty regularly.   Constipation: not chronic issue  currently.  GERD: not issue currently. Eating healthier has helped.   Was getting recurrent bladder infections and couldn't go more than 6 weeks without infection. Over a year going on and then had a lot of blood in toilet. Had bladder tumor excised. Benign tumor.   Just had second colonoscopy.   Past Medical History:  Diagnosis Date  . Anxiety   . Back pain   . Colon polyps   . Diverticulitis   . Ehlers-Danlos syndrome type III   . GERD (gastroesophageal reflux disease)   . Hypertension   . Migraines    Past Surgical History:  Procedure Laterality Date  . ABDOMINAL HYSTERECTOMY  2012   menorrhagia; kept cervix/ovaries  . BLADDER TUMOR EXCISION  2013   Allergies  Allergen Reactions  . Latex Other (See Comments)   Current Meds  Medication Sig  . HYDROcodone-acetaminophen (NORCO/VICODIN) 5-325 MG per tablet Take 1 tablet by mouth every 6 (six) hours as needed for moderate pain.  . Ibuprofen (MOTRIN PO) Take by mouth.  . Multiple Vitamins-Minerals (HAIR SKIN AND NAILS FORMULA PO) Take by mouth.  . naproxen sodium (ALEVE) 220 MG tablet Take 220 mg by mouth.  Marland Kitchen OVER THE COUNTER MEDICATION It works thermofight/fat burn supplement  . rizatriptan (MAXALT) 10 MG tablet Take 10 mg by mouth as needed.   Social History   Tobacco Use  . Smoking status: Never Smoker  . Smokeless tobacco: Never Used  Substance Use Topics  . Alcohol use: Yes    Alcohol/week: 21.0 standard drinks    Types: 21 Standard drinks or equivalent per week    Comment: occasionally    Family History  Problem Relation Age of Onset  . Colon cancer Mother 36  . Breast cancer Mother 56  . Colon cancer Maternal Uncle 58  . Heart disease Father   . Aortic aneurysm Father 11  . Aortic aneurysm Paternal Grandfather 35  . Rectal cancer Neg Hx      Review of Systems  Constitutional: Positive for fatigue. Negative for chills and fever.  Respiratory: Negative for cough, chest tightness, shortness of breath  and wheezing.   Cardiovascular: Negative for chest pain, palpitations and leg swelling.  Musculoskeletal: Positive for arthralgias.  Neurological: Positive for headaches.    Objective:  BP 120/80 (BP Location: Left Arm, Patient Position: Sitting, Cuff Size: Normal)   Pulse 72   Temp 98.1 F (36.7 C) (Oral)   Ht 5\' 7"  (1.702 m)   Wt 150 lb 8 oz (68.3 kg)   BMI 23.57 kg/m   Weight: 150 lb 8 oz (68.3 kg)   BP Readings from Last 3 Encounters:  03/28/20 120/80  08/07/19 (!) 126/91  07/09/19 122/80   Wt Readings from Last 3 Encounters:  03/28/20 150 lb 8 oz (68.3 kg)  08/07/19 157 lb (71.2 kg)  07/09/19 157 lb (71.2 kg)    Physical Exam Constitutional:      General: She is not in acute distress.    Appearance: She is well-developed.  Cardiovascular:     Rate and Rhythm: Normal rate and regular rhythm.     Heart sounds: Normal heart sounds. No murmur heard.  No friction rub.  Pulmonary:     Effort: Pulmonary effort is normal. No respiratory distress.     Breath sounds: Normal breath sounds. No wheezing or rales.  Musculoskeletal:     Right lower leg: No edema.     Left lower leg: No edema.  Neurological:     Mental Status: She is alert and oriented to person, place, and time.  Psychiatric:        Behavior: Behavior normal.     Assessment/Plan:  1. Estrogen deficiency Due to multiple fractures, we will get a bone density to establish baseline.  We will discuss hormonal treatment options pending these results. - DG Bone Density; Future  2. Multiple fractures See above.  We will check and make sure her vitamin D levels are normal. - DG Bone Density; Future - VITAMIN D 25 Hydroxy (Vit-D Deficiency, Fractures); Future - VITAMIN D 25 Hydroxy (Vit-D Deficiency, Fractures)  3. Encounter for screening mammogram for malignant neoplasm of breast - MM DIGITAL SCREENING BILATERAL; Future  4. Other migraine without status migrainosus, not intractable Occasionally getting  migraines that oral Imitrex is not resolving discomfort with.  May have been related to adding hormones in.  She will continue to monitor.  Trial of sumatriptan injection since this is quicker acting. - SUMAtriptan (IMITREX) 6 MG/0.5ML SOSY injection; Inject 0.5 mLs (6 mg total) into the skin once for 1 dose. Repeat in 2 hours if no relief  Dispense: 1 mL; Refill: 5 - Magnesium, RBC; Future - Magnesium, RBC  5. Family history of aortic aneurysm Multiple family members with aortic aneurysm rupture.  Does have Ehlers-Danlos, and screening ultrasound warranted. - US AORTA; Future  6. Blood pressure elevated without history of HTN Encouraged her to monitor at home. - CBC with  Differential/Platelet; Future - Comprehensive metabolic panel; Future - Comprehensive metabolic panel - CBC with Differential/Platelet  7. Lipid screening - Lipid panel; Future - Lipid panel  8. Fatigue, unspecified type - TSH; Future - VITAMIN D 25 Hydroxy (Vit-D Deficiency, Fractures); Future - Vitamin B12; Future - Iron, TIBC and Ferritin Panel; Future - Iron, TIBC and Ferritin Panel - Vitamin B12 - VITAMIN D 25 Hydroxy (Vit-D Deficiency, Fractures) - TSH  Return for pending labs, imaging.  Micheline Rough, MD   Time with patient, time charting, chart review, chronic condition review over 45 minutes.

## 2020-03-28 NOTE — Patient Instructions (Signed)
Consider Riboflavin 400mg  daily (takes 3 months to see the benefit)

## 2020-03-29 LAB — CBC WITH DIFFERENTIAL/PLATELET
Absolute Monocytes: 620 cells/uL (ref 200–950)
Basophils Absolute: 53 cells/uL (ref 0–200)
Basophils Relative: 0.8 %
Eosinophils Absolute: 112 cells/uL (ref 15–500)
Eosinophils Relative: 1.7 %
HCT: 42.6 % (ref 35.0–45.0)
Hemoglobin: 14.3 g/dL (ref 11.7–15.5)
Lymphs Abs: 1254 cells/uL (ref 850–3900)
MCH: 31.8 pg (ref 27.0–33.0)
MCHC: 33.6 g/dL (ref 32.0–36.0)
MCV: 94.7 fL (ref 80.0–100.0)
MPV: 11.8 fL (ref 7.5–12.5)
Monocytes Relative: 9.4 %
Neutro Abs: 4561 cells/uL (ref 1500–7800)
Neutrophils Relative %: 69.1 %
Platelets: 261 10*3/uL (ref 140–400)
RBC: 4.5 10*6/uL (ref 3.80–5.10)
RDW: 11.8 % (ref 11.0–15.0)
Total Lymphocyte: 19 %
WBC: 6.6 10*3/uL (ref 3.8–10.8)

## 2020-03-29 LAB — COMPREHENSIVE METABOLIC PANEL
AG Ratio: 1.9 (calc) (ref 1.0–2.5)
ALT: 16 U/L (ref 6–29)
AST: 16 U/L (ref 10–35)
Albumin: 4.9 g/dL (ref 3.6–5.1)
Alkaline phosphatase (APISO): 83 U/L (ref 37–153)
BUN: 16 mg/dL (ref 7–25)
CO2: 27 mmol/L (ref 20–32)
Calcium: 10.6 mg/dL — ABNORMAL HIGH (ref 8.6–10.4)
Chloride: 99 mmol/L (ref 98–110)
Creat: 0.66 mg/dL (ref 0.50–1.05)
Globulin: 2.6 g/dL (calc) (ref 1.9–3.7)
Glucose, Bld: 89 mg/dL (ref 65–99)
Potassium: 4.6 mmol/L (ref 3.5–5.3)
Sodium: 138 mmol/L (ref 135–146)
Total Bilirubin: 0.5 mg/dL (ref 0.2–1.2)
Total Protein: 7.5 g/dL (ref 6.1–8.1)

## 2020-03-29 LAB — TSH: TSH: 1.11 mIU/L

## 2020-03-29 LAB — LIPID PANEL
Cholesterol: 272 mg/dL — ABNORMAL HIGH (ref <200)
HDL: 78 mg/dL (ref >=50)
LDL Cholesterol (Calc): 163 mg/dL (calc) — ABNORMAL HIGH
Non-HDL Cholesterol (Calc): 194 mg/dL (calc) — ABNORMAL HIGH (ref <130)
Total CHOL/HDL Ratio: 3.5 (calc) (ref <5.0)
Triglycerides: 164 mg/dL — ABNORMAL HIGH (ref <150)

## 2020-03-29 LAB — VITAMIN D 25 HYDROXY (VIT D DEFICIENCY, FRACTURES): Vit D, 25-Hydroxy: 51 ng/mL (ref 30–100)

## 2020-03-29 LAB — VITAMIN B12: Vitamin B-12: 575 pg/mL (ref 200–1100)

## 2020-04-01 LAB — IRON,TIBC AND FERRITIN PANEL
%SAT: 19 % (calc) (ref 16–45)
Ferritin: 134 ng/mL (ref 16–232)
Iron: 80 ug/dL (ref 45–160)
TIBC: 432 mcg/dL (calc) (ref 250–450)

## 2020-04-01 LAB — MAGNESIUM, RBC: Magnesium RBC: 6.1 mg/dL (ref 4.0–6.4)

## 2020-04-02 ENCOUNTER — Encounter: Payer: Self-pay | Admitting: Family Medicine

## 2020-04-04 ENCOUNTER — Encounter: Payer: Self-pay | Admitting: Family Medicine

## 2020-04-16 ENCOUNTER — Ambulatory Visit (INDEPENDENT_AMBULATORY_CARE_PROVIDER_SITE_OTHER): Payer: BC Managed Care – PPO

## 2020-04-16 ENCOUNTER — Other Ambulatory Visit: Payer: Self-pay

## 2020-04-16 DIAGNOSIS — Z8249 Family history of ischemic heart disease and other diseases of the circulatory system: Secondary | ICD-10-CM

## 2020-04-16 DIAGNOSIS — E2839 Other primary ovarian failure: Secondary | ICD-10-CM | POA: Diagnosis not present

## 2020-04-16 DIAGNOSIS — T07XXXA Unspecified multiple injuries, initial encounter: Secondary | ICD-10-CM | POA: Diagnosis not present

## 2020-04-16 DIAGNOSIS — Z1231 Encounter for screening mammogram for malignant neoplasm of breast: Secondary | ICD-10-CM | POA: Diagnosis not present

## 2020-04-17 ENCOUNTER — Encounter: Payer: Self-pay | Admitting: Family Medicine

## 2020-04-21 ENCOUNTER — Encounter: Payer: Self-pay | Admitting: Family Medicine

## 2020-04-22 ENCOUNTER — Encounter: Payer: Self-pay | Admitting: Family Medicine

## 2020-04-22 DIAGNOSIS — T07XXXA Unspecified multiple injuries, initial encounter: Secondary | ICD-10-CM

## 2020-04-22 DIAGNOSIS — M8580 Other specified disorders of bone density and structure, unspecified site: Secondary | ICD-10-CM

## 2020-05-06 ENCOUNTER — Encounter: Payer: Self-pay | Admitting: Family Medicine

## 2020-05-06 ENCOUNTER — Telehealth: Payer: Self-pay | Admitting: Family Medicine

## 2020-05-06 NOTE — Telephone Encounter (Signed)
error 

## 2020-05-07 DIAGNOSIS — M85852 Other specified disorders of bone density and structure, left thigh: Secondary | ICD-10-CM | POA: Insufficient documentation

## 2020-05-07 DIAGNOSIS — R5383 Other fatigue: Secondary | ICD-10-CM | POA: Insufficient documentation

## 2020-08-28 DIAGNOSIS — M79631 Pain in right forearm: Secondary | ICD-10-CM | POA: Insufficient documentation

## 2020-11-14 ENCOUNTER — Other Ambulatory Visit: Payer: Self-pay

## 2020-11-14 ENCOUNTER — Other Ambulatory Visit: Payer: Self-pay | Admitting: Family Medicine

## 2020-11-14 ENCOUNTER — Ambulatory Visit: Payer: BC Managed Care – PPO | Admitting: Family Medicine

## 2020-11-14 ENCOUNTER — Encounter: Payer: Self-pay | Admitting: Family Medicine

## 2020-11-14 VITALS — BP 128/70 | HR 71 | Temp 97.3°F | Ht 67.0 in | Wt 156.0 lb

## 2020-11-14 DIAGNOSIS — M8580 Other specified disorders of bone density and structure, unspecified site: Secondary | ICD-10-CM | POA: Diagnosis not present

## 2020-11-14 DIAGNOSIS — Z8249 Family history of ischemic heart disease and other diseases of the circulatory system: Secondary | ICD-10-CM | POA: Diagnosis not present

## 2020-11-14 DIAGNOSIS — E785 Hyperlipidemia, unspecified: Secondary | ICD-10-CM

## 2020-11-14 NOTE — Patient Instructions (Signed)
Calcium Content in Foods Calcium is the most abundant mineral in the body. Most of the body's calcium supply is stored in bones and teeth. Calcium helps many parts of the body function normally, including:  Blood and blood vessels.  Nerves.  Hormones.  Muscles.  Bones and teeth. When your calcium stores are low, you may be at risk for low bone mass, bone loss, and broken bones (fractures). When you get enough calcium, it helps to support strong bones and teeth throughout your life. Calcium is especially important for:  Children during growth spurts.  Girls during adolescence.  Women who are pregnant or breastfeeding.  Women after their menstrual cycle stops (postmenopause).  Women whose menstrual cycle has stopped due to anorexia nervosa or regular intense exercise.  People who cannot eat or digest dairy products.  Vegans. Recommended daily amounts of calcium:  Women (ages 46 to 63): 1,000 mg per day.  Women (ages 5 and older): 1,200 mg per day.  Men (ages 59 to 62): 1,000 mg per day.  Men (ages 57 and older): 1,200 mg per day.  Women (ages 28 to 49): 1,300 mg per day.  Men (ages 74 to 71): 1,300 mg per day. General information  Eat foods that are high in calcium. Try to get most of your calcium from food.  Some people may benefit from taking calcium supplements. Check with your health care provider or diet and nutrition specialist (dietitian) before starting any calcium supplements. Calcium supplements may interact with certain medicines. Too much calcium may cause other health problems, such as constipation and kidney stones.  For the body to absorb calcium, it needs vitamin D. Sources of vitamin D include: ? Skin exposure to direct sunlight. ? Foods, such as egg yolks, liver, mushrooms, saltwater fish, and fortified milk. ? Vitamin D supplements. Check with your health care provider or dietitian before starting any vitamin D supplements. What foods are high in  calcium? Foods that are high in calcium contain more than 100 milligrams per serving. Fruits  Fortified orange juice or other fruit juice, 300 mg per 8 oz serving. Vegetables  Collard greens, 360 mg per 8 oz serving.  Kale, 100 mg per 8 oz serving.  Bok choy, 160 mg per 8 oz serving. Grains  Fortified ready-to-eat cereals, 100 to 1,000 mg per 8 oz serving.  Fortified frozen waffles, 200 mg in 2 waffles.  Oatmeal, 140 mg in 1 cup. Meats and other proteins  Sardines, canned with bones, 325 mg per 3 oz serving.  Salmon, canned with bones, 180 mg per 3 oz serving.  Canned shrimp, 125 mg per 3 oz serving.  Baked beans, 160 mg per 4 oz serving.  Tofu, firm, made with calcium sulfate, 253 mg per 4 oz serving. Dairy  Yogurt, plain, low-fat, 310 mg per 6 oz serving.  Nonfat milk, 300 mg per 8 oz serving.  American cheese, 195 mg per 1 oz serving.  Cheddar cheese, 205 mg per 1 oz serving.  Cottage cheese 2%, 105 mg per 4 oz serving.  Fortified soy, rice, or almond milk, 300 mg per 8 oz serving.  Mozzarella, part skim, 210 mg per 1 oz serving. The items listed above may not be a complete list of foods high in calcium. Actual amounts of calcium may be different depending on processing. Contact a dietitian for more information.   What foods are lower in calcium? Foods that are lower in calcium contain 50 mg or less per serving. Fruits  Apple,  about 6 mg.  Banana, about 12 mg. Vegetables  Lettuce, 19 mg per 2 oz serving.  Tomato, about 11 mg. Grains  Rice, 4 mg per 6 oz serving.  Boiled potatoes, 14 mg per 8 oz serving.  White bread, 6 mg per slice. Meats and other proteins  Egg, 27 mg per 2 oz serving.  Red meat, 7 mg per 4 oz serving.  Chicken, 17 mg per 4 oz serving.  Fish, cod, or trout, 20 mg per 4 oz serving. Dairy  Cream cheese, regular, 14 mg per 1 Tbsp serving.  Brie cheese, 50 mg per 1 oz serving.  Parmesan cheese, 70 mg per 1 Tbsp  serving. The items listed above may not be a complete list of foods lower in calcium. Actual amounts of calcium may be different depending on processing. Contact a dietitian for more information. Summary  Calcium is an important mineral in the body because it affects many functions. Getting enough calcium helps support strong bones and teeth throughout your life.  Try to get most of your calcium from food.  Calcium supplements may interact with certain medicines. Check with your health care provider or dietitian before starting any calcium supplements. This information is not intended to replace advice given to you by your health care provider. Make sure you discuss any questions you have with your health care provider. Document Revised: 11/28/2019 Document Reviewed: 11/28/2019 Elsevier Patient Education  2021 [place].  Osteopenia  Osteopenia is a loss of thickness (density) inside the bones. Another name for osteopenia is low bone mass. Mild osteopenia is a normal part of aging. It is not a disease, and it does not cause symptoms. However, if you have osteopenia and continue to lose bone mass, you could develop a condition that causes the bones to become thin and break more easily (osteoporosis). Osteoporosis can cause you to lose some height, have back pain, and have a stooped posture. Although osteopenia is not a disease, making changes to your lifestyle and diet can help to prevent osteopenia from developing into osteoporosis. What are the causes? Osteopenia is caused by loss of calcium in the bones. Bones are constantly changing. Old bone cells are continually being replaced with new bone cells. This process builds new bone. The mineral calcium is needed to build new bone and maintain bone density. Bone density is usually highest around age 62. After that, most people's bodies cannot replace all the bone they have lost with new bone. What increases the risk? You are more likely to  develop this condition if:  You are older than age 70.  You are a woman who went through menopause early.  You have a long illness that keeps you in bed.  You do not get enough exercise.  You lack certain nutrients (malnutrition).  You have an overactive thyroid gland (hyperthyroidism).  You use products that contain nicotine or tobacco, such as cigarettes, e-cigarettes and chewing tobacco, or you drink a lot of alcohol.  You are taking medicines that weaken the bones, such as steroids. What are the signs or symptoms? This condition does not cause any symptoms. You may have a slightly higher risk for bone breaks (fractures), so getting fractures more easily than normal may be an indication of osteopenia. How is this diagnosed? This condition may be diagnosed based on an X-ray exam that measures bone density (dual-energy X-ray absorptiometry, or DEXA). This test can measure bone density in your hips, spine, and wrists. Osteopenia has no symptoms, so  this condition is usually diagnosed after a routine bone density screening test is done for osteoporosis. This routine screening is usually done for:  Women who are age 50 or older.  Men who are age 51 or older. If you have risk factors for osteopenia, you may have the screening test at an earlier age. How is this treated? Making dietary and lifestyle changes can lower your risk for osteoporosis. If you have severe osteopenia that is close to becoming osteoporosis, this condition can be treated with medicines and dietary supplements such as calcium and vitamin D. These supplements help to rebuild bone density. Follow these instructions at home: Eating and drinking Eat a diet that is high in calcium and vitamin D.  Calcium is found in dairy products, beans, salmon, and leafy green vegetables like spinach and broccoli.  Look for foods that have vitamin D and calcium added to them (fortified foods), such as orange juice, cereal, and bread.    Lifestyle  Do 30 minutes or more of a weight-bearing exercise every day, such as walking, jogging, or playing a sport. These types of exercises strengthen the bones.  Do not use any products that contain nicotine or tobacco, such as cigarettes, e-cigarettes, and chewing tobacco. If you need help quitting, ask your health care provider.  Do not drink alcohol if: ? Your health care provider tells you not to drink. ? You are pregnant, may be pregnant, or are planning to become pregnant.  If you drink alcohol: ? Limit how much you use to:  0-1 drink a day for women.  0-2 drinks a day for men. ? Be aware of how much alcohol is in your drink. In the [place] one drink equals one 12 oz bottle of beer (355 mL), one 5 oz glass of wine (148 mL), or one 1 oz glass of hard liquor (44 mL). General instructions  Take over-the-counter and prescription medicines only as told by your health care provider. These include vitamins and supplements.  Take precautions at home to lower your risk of falling, such as: ? Keeping rooms well-lit and free of clutter, such as cords. ? Installing safety rails on stairs. ? Using rubber mats in the bathroom or other areas that are often wet or slippery.  Keep all follow-up visits. This is important. Contact a health care provider if:  You have not had a bone density screening for osteoporosis and you are: ? A woman who is age 20 or older. ? A man who is age 26 or older.  You are a postmenopausal woman who has not had a bone density screening for osteoporosis.  You are older than age 65 and you want to know if you should have bone density screening for osteoporosis. Summary  Osteopenia is a loss of thickness (density) inside the bones. Another name for osteopenia is low bone mass.  Osteopenia is not a disease, but it may increase your risk for a condition that causes the bones to become thin and break more easily (osteoporosis).  You may be at risk for  osteopenia if you are older than age 72 or if you are a woman who went through early menopause.  Osteopenia does not cause any symptoms, but it can be diagnosed with a bone density screening test.  Dietary and lifestyle changes are the first treatment for osteopenia. These may lower your risk for osteoporosis. This information is not intended to replace advice given to you by your health care provider. Make sure you discuss  any questions you have with your health care provider. Document Revised: 01/17/2020 Document Reviewed: 01/17/2020 Elsevier Patient Education  [place].

## 2020-11-14 NOTE — Progress Notes (Signed)
Lydia [name] DOB: [birth year] Encounter date: 11/14/2020  This is a 55 y.o. female who presents with Chief Complaint  Patient presents with  . Follow-up    History of present illness:  Here for follow up on lipids panel after elevated. She saw endo - didn't get call back. Just bad experience overall. They were going to get bone density results and get back with her. This was with Dr. Tamala Julian through [place] about calcium intake. She does like cheese; avoids milk due to lactose intolerance.   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   Energy level stays bad. Seems to sleep better with progesterone. Also taking melatonin - helps somewhat. Both trouble falling and staying asleep. Had a little break from hot flashes for a couple of months - seemed to come back with vengeance. In past estrogen made her gain weight which she didn't like. She lost 17lbs with optavia; but hard to do this with work. Would like to work on adding in walking.   Pain isn't good. Has another torn ligament. Follows up in a week. Follows with Dr. Collene Gobble for this.   Migraines have been doing better - thinks related to better sleep now. Avoiding MSG which she thinks helps. Hasn't had one in 3 months.   Taking Jublia topical for toenail fungus. Using patient assistance for this.  black licorice triggers migraine  Had episode of she thinks esoph spasm; has had sensation of things getting stuck/spasm in the past. Very uncomfortable when it occurs.   Allergies  Allergen Reactions  . Latex Other (See Comments)   Current Meds  Medication Sig  . clotrimazole (LOTRIMIN) 1 % external solution Apply topically.  Marland Kitchen HYDROcodone-acetaminophen (NORCO/VICODIN) 5-325 MG per tablet Take 1 tablet by mouth every 6 (six) hours as needed for moderate pain.  . Ibuprofen (MOTRIN PO) Take by mouth.  . Multiple Vitamins-Minerals (HAIR SKIN AND NAILS FORMULA PO) Take by mouth.  . naproxen  sodium (ALEVE) 220 MG tablet Take 220 mg by mouth.  Marland Kitchen OVER THE COUNTER MEDICATION It works thermofight/fat burn supplement  . rizatriptan (MAXALT) 10 MG tablet Take 10 mg by mouth as needed.    Review of Systems  Constitutional: Positive for fatigue. Negative for chills and fever.  Respiratory: Negative for cough, chest tightness, shortness of breath and wheezing.   Cardiovascular: Negative for chest pain, palpitations and leg swelling.  Musculoskeletal: Positive for arthralgias and myalgias.    Objective:  BP 128/70   Pulse 71   Temp (!) 97.3 F (36.3 C) (Oral)   Ht 5\' 7"  (1.702 m)   Wt 156 lb (70.8 kg)   SpO2 99%   BMI 24.43 kg/m   Weight: 156 lb (70.8 kg)   BP Readings from Last 3 Encounters:  11/14/20 128/70  03/28/20 120/80  08/07/19 (!) 126/91   Wt Readings from Last 3 Encounters:  11/14/20 156 lb (70.8 kg)  03/28/20 150 lb 8 oz (68.3 kg)  08/07/19 157 lb (71.2 kg)    Physical Exam Constitutional:      General: She is not in acute distress.    Appearance: She is well-developed.  Cardiovascular:     Rate and Rhythm: Normal rate and regular rhythm.     Heart sounds: Normal heart sounds. No murmur heard. No friction rub.  Pulmonary:     Effort: Pulmonary effort is normal. No respiratory distress.     Breath sounds: Normal breath sounds. No  wheezing or rales.  Musculoskeletal:     Right lower leg: No edema.     Left lower leg: No edema.  Neurological:     Mental Status: She is alert and oriented to person, place, and time.  Psychiatric:        Behavior: Behavior normal.     Assessment/Plan  1. Hyperlipidemia, unspecified hyperlipidemia type - Lipid panel; Future - Lipid panel  2. Hypercalcemia Discussed that from bone density standpoint; she can likely get all calcium she needs through dietary means. Handout included on AVS for her.  - PTH, Intact and Calcium; Future - Comprehensive metabolic panel; Future - PTH, Intact and Calcium - Comprehensive  metabolic panel  3. Osteopenia, unspecified location See above; suggested vitamin D supplementation pending lab results and could consider in low amount to make sure stores normal; but calcium through diet.  - VITAMIN D 25 Hydroxy (Vit-D Deficiency, Fractures); Future - VITAMIN D 25 Hydroxy (Vit-D Deficiency, Fractures)  4. Family history of aortic aneurysm Father, grandfather with fatal AAA. Given hypermobility disorder and potential for other collagen disease; will screen. - US AORTA DUPLEX COMPLETE; Future   Return for pending labs.    Micheline Rough, MD

## 2020-11-17 ENCOUNTER — Other Ambulatory Visit: Payer: Self-pay | Admitting: Family Medicine

## 2020-11-17 ENCOUNTER — Telehealth: Payer: Self-pay | Admitting: Family Medicine

## 2020-11-17 ENCOUNTER — Encounter: Payer: Self-pay | Admitting: Family Medicine

## 2020-11-17 DIAGNOSIS — Z8249 Family history of ischemic heart disease and other diseases of the circulatory system: Secondary | ICD-10-CM

## 2020-11-17 LAB — COMPREHENSIVE METABOLIC PANEL
AG Ratio: 2 (calc) (ref 1.0–2.5)
ALT: 15 U/L (ref 6–29)
AST: 18 U/L (ref 10–35)
Albumin: 4.7 g/dL (ref 3.6–5.1)
Alkaline phosphatase (APISO): 85 U/L (ref 37–153)
BUN: 16 mg/dL (ref 7–25)
CO2: 25 mmol/L (ref 20–32)
Calcium: 9.9 mg/dL (ref 8.6–10.4)
Chloride: 102 mmol/L (ref 98–110)
Creat: 0.66 mg/dL (ref 0.50–1.05)
Globulin: 2.4 g/dL (calc) (ref 1.9–3.7)
Glucose, Bld: 88 mg/dL (ref 65–99)
Potassium: 4.8 mmol/L (ref 3.5–5.3)
Sodium: 139 mmol/L (ref 135–146)
Total Bilirubin: 0.4 mg/dL (ref 0.2–1.2)
Total Protein: 7.1 g/dL (ref 6.1–8.1)

## 2020-11-17 LAB — LIPID PANEL
Cholesterol: 272 mg/dL — ABNORMAL HIGH (ref <200)
HDL: 76 mg/dL (ref >=50)
LDL Cholesterol (Calc): 162 mg/dL (calc) — ABNORMAL HIGH
Non-HDL Cholesterol (Calc): 196 mg/dL (calc) — ABNORMAL HIGH (ref <130)
Total CHOL/HDL Ratio: 3.6 (calc) (ref <5.0)
Triglycerides: 184 mg/dL — ABNORMAL HIGH (ref <150)

## 2020-11-17 LAB — PTH, INTACT AND CALCIUM
Calcium: 9.9 mg/dL (ref 8.6–10.4)
PTH: 29 pg/mL (ref 16–77)

## 2020-11-17 LAB — VITAMIN D 25 HYDROXY (VIT D DEFICIENCY, FRACTURES): Vit D, 25-Hydroxy: 44 ng/mL (ref 30–100)

## 2020-11-17 LAB — HOUSE ACCOUNT TRACKING

## 2020-11-17 NOTE — Telephone Encounter (Signed)
New order placed

## 2020-11-17 NOTE — Telephone Encounter (Signed)
Order has a incorrect location  In order to schedule  Please change order location  To [facility] [id number]  Please use location [address] .   US AORTA DUPLEX COMPLETE    Order Date: 11/14/2020 Proc Category: Beverely Low  Priority: Routine Class: Ancillary Performed  Standing Status: Future Expires: 11/14/2021 Status: Sent [2]  Ordering User: Caren Macadam, MD Department: [facility]  Auth Provider: Riki Rusk Provider: Caren Macadam, MD

## 2020-11-19 NOTE — Telephone Encounter (Signed)
Patient informed of the message below-see results note also.

## 2020-11-25 ENCOUNTER — Encounter: Payer: Self-pay | Admitting: Family Medicine

## 2020-11-25 DIAGNOSIS — E785 Hyperlipidemia, unspecified: Secondary | ICD-10-CM

## 2020-11-25 NOTE — Progress Notes (Signed)
You can certainly try increased walking to see if this helps with cholesterol number. At some point though, there is just a protective benefit of being on the statin itself as it helps to stabilize cholesterol deposits that are already there. BUT, I understand hesitancy, especially with the aches and pains you already have. I would suggest continuing with your healthy diet. Mediterranean style diets, more plant based diets tend to be the best for cholesterol. We can recheck lipids in 3-6 months with your regular walking and see if you are able to make those numbers budge.

## 2020-12-03 ENCOUNTER — Ambulatory Visit (HOSPITAL_COMMUNITY): Admission: RE | Admit: 2020-12-03 | Payer: BC Managed Care – PPO | Source: Ambulatory Visit

## 2020-12-24 ENCOUNTER — Ambulatory Visit (HOSPITAL_COMMUNITY)
Admission: RE | Admit: 2020-12-24 | Payer: BC Managed Care – PPO | Source: Ambulatory Visit | Attending: Family Medicine | Admitting: Family Medicine

## 2021-02-25 ENCOUNTER — Telehealth: Payer: Self-pay | Admitting: Family Medicine

## 2021-02-25 ENCOUNTER — Encounter: Payer: Self-pay | Admitting: Family Medicine

## 2021-02-25 NOTE — Telephone Encounter (Signed)
Pt is calling in needing a refill on Rx Progesterone 100 MG and medication is not on the current medication list.  Pharm:  [facility] in [facility]

## 2021-02-26 NOTE — Telephone Encounter (Signed)
The patient was calling to follow up on her message. She hasn't heard anything back from Dr. Ethlyn Gallery. She will be out of this medication tomorrow. She doesn't want to go the weekend without this medication.  I advised the patient that Dr. Ethlyn Gallery is out of the office today.

## 2021-02-27 MED ORDER — PROGESTERONE MICRONIZED 100 MG PO CAPS: 100.0000 mg | ORAL_CAPSULE | Freq: Every day | ORAL | 5 refills | Status: DC

## 2021-05-08 ENCOUNTER — Other Ambulatory Visit: Payer: Self-pay

## 2021-05-08 ENCOUNTER — Encounter: Payer: Self-pay | Admitting: Family Medicine

## 2021-05-08 ENCOUNTER — Ambulatory Visit: Payer: BC Managed Care – PPO | Admitting: Family Medicine

## 2021-05-08 VITALS — BP 110/80 | HR 69 | Temp 97.7°F | Ht 67.0 in | Wt 158.2 lb

## 2021-05-08 DIAGNOSIS — G4700 Insomnia, unspecified: Secondary | ICD-10-CM

## 2021-05-08 DIAGNOSIS — E785 Hyperlipidemia, unspecified: Secondary | ICD-10-CM | POA: Diagnosis not present

## 2021-05-08 DIAGNOSIS — Z23 Encounter for immunization: Secondary | ICD-10-CM | POA: Diagnosis not present

## 2021-05-08 DIAGNOSIS — Z8619 Personal history of other infectious and parasitic diseases: Secondary | ICD-10-CM | POA: Diagnosis not present

## 2021-05-08 DIAGNOSIS — Z13220 Encounter for screening for lipoid disorders: Secondary | ICD-10-CM | POA: Diagnosis not present

## 2021-05-08 DIAGNOSIS — R300 Dysuria: Secondary | ICD-10-CM

## 2021-05-08 DIAGNOSIS — R232 Flushing: Secondary | ICD-10-CM

## 2021-05-08 MED ORDER — ESTRADIOL 0.5 MG PO TABS: 0.5000 mg | ORAL_TABLET | Freq: Every day | ORAL | 1 refills | Status: DC

## 2021-05-08 MED ORDER — HYDROXYZINE HCL 25 MG PO TABS: 25.0000 mg | ORAL_TABLET | Freq: Two times a day (BID) | ORAL | 2 refills | Status: DC | PRN

## 2021-05-08 NOTE — Patient Instructions (Signed)
Decrease the progesterone to 100mg  nightly for at least 3 nights (you can extend this if not feeling well) then take every other day for a few doses, then stop. You can back up the taper at any point (ie if going down to 100mg  daily give symptoms then take alternating 200/100mg  for a week and then go down to 100mg ).

## 2021-05-08 NOTE — Progress Notes (Signed)
Lydia [name] Luciana Axe DOB: 1966-05-12 Encounter date: 05/08/2021  This is a 55 y.o. female who presents with Chief Complaint  Patient presents with   Follow-up    History of present illness:  In past when saw [facility]. This was first time they had discussed menopause and she was fully in menopause at that time - she was having hot flashes. She was put on estradiol patch and then progesterone. When she went to see ortho last time for pain med checked it was suggested to talk with tiffany (?) someone in area who deals with hormonal treatment. She was also given testosterone cream in past but didn't use this.   She had bad anxiety reaction when she had stopped the progesterone before (ran out). Feels that she has gained weight with the progesterone. Doesn't like being on this. Still has hot flashes daily. Does wake up with them through night. Ambien keeps her asleep for the night; unless she takes just 5mg . Does have vaginal dryness.   Oldest daughter has anxiety as well. She is taking lexapro, seeing therapist. Mikinzie tried trazodone for sleep, didn't work for her. Daughter just started hydroxyzine for sleep. She feels like memory isn't great on ambien; wondering if something else might be better.   Hasn't seen any blood in urine, but has had some discomfort with urination more in last couple of weeks.   When she was 89 and went to give blood she tested positive for Hep C. Her mom had hep C and went through treatment. Hep C was tested negative on bloodwork since then.   Husband took muscadine which helped a lot. If levels still high she would like to try this first.   Allergies  Allergen Reactions   Latex Other (See Comments)   Current Meds  Medication Sig   estradiol (ESTRACE) 0.5 MG tablet Take 1 tablet (0.5 mg total) by mouth daily.   HYDROcodone-acetaminophen (NORCO/VICODIN) 5-325 MG per tablet Take 1 tablet by mouth every 6 (six) hours as needed for moderate pain.    hydrOXYzine (ATARAX/VISTARIL) 25 MG tablet Take 1 tablet (25 mg total) by mouth 3 times/day as needed-between meals & bedtime.   Ibuprofen (MOTRIN PO) Take by mouth.   Multiple Vitamins-Minerals (HAIR SKIN AND NAILS FORMULA PO) Take by mouth.   naproxen sodium (ALEVE) 220 MG tablet Take 220 mg by mouth.   progesterone (PROMETRIUM) 100 MG capsule Take 1-2 capsules (100-200 mg total) by mouth daily.   rizatriptan (MAXALT) 10 MG tablet Take 10 mg by mouth as needed.   zolpidem (AMBIEN) 10 MG tablet Take 10 mg by mouth at bedtime as needed for sleep.    Review of Systems  Constitutional:  Negative for chills, fatigue and fever.  Respiratory:  Negative for cough, chest tightness, shortness of breath and wheezing.   Cardiovascular:  Negative for chest pain, palpitations and leg swelling.  Genitourinary:  Vaginal pain: dryness.  Musculoskeletal:  Positive for arthralgias and myalgias.  Psychiatric/Behavioral:  Positive for sleep disturbance.    Objective:  BP 110/80 (BP Location: Left Arm, Patient Position: Sitting, Cuff Size: Normal)   Pulse 69   Temp 97.7 F (36.5 C) (Oral)   Ht 5\' 7"  (1.702 m)   Wt 158 lb 3.2 oz (71.8 kg)   SpO2 96%   BMI 24.78 kg/m   Weight: 158 lb 3.2 oz (71.8 kg)   BP Readings from Last 3 Encounters:  05/08/21 110/80  11/14/20 128/70  03/28/20 120/80   Wt Readings from  Last 3 Encounters:  05/08/21 158 lb 3.2 oz (71.8 kg)  11/14/20 156 lb (70.8 kg)  03/28/20 150 lb 8 oz (68.3 kg)    Physical Exam Constitutional:      General: She is not in acute distress.    Appearance: She is well-developed.  Cardiovascular:     Rate and Rhythm: Normal rate and regular rhythm.     Heart sounds: Normal heart sounds. No murmur heard.   No friction rub.  Pulmonary:     Effort: Pulmonary effort is normal. No respiratory distress.     Breath sounds: Normal breath sounds. No wheezing or rales.  Musculoskeletal:     Right lower leg: No edema.     Left lower leg: No edema.   Feet:     Comments: Bunion right foot; no other palpable abnormality or reproducible tenderness  Neurological:     Mental Status: She is alert and oriented to person, place, and time.  Psychiatric:        Behavior: Behavior normal.    Assessment/Plan  1. History of hepatitis C Patient states she was told she had hep C in past; then cleared without treatment. We will get panel today. - Hepatitis C antibody; Future - Acute Hep Panel & Hep B Surface Ab; Future - Acute Hep Panel & Hep B Surface Ab - Hepatitis C antibody  2. Hyperlipidemia, unspecified hyperlipidemia type - Comprehensive metabolic panel; Future - TSH; Future - TSH - Comprehensive metabolic panel  3. Dysuria - CBC with Differential/Platelet; Future - Urinalysis with Culture Reflex; Future - Urinalysis with Culture Reflex - CBC with Differential/Platelet  4. Lipid screening - Lipid panel; Future - Lipid panel  5. Hot flashes She has been on progesterone only. We discussed benefits of estrogen, and we are to start this at a low dose.  She has had a hysterectomy.  She understands risks of hormonal treatment, but we are hoping that this helps with hot flashes as well as mood.  With her uterus, she does not need to continue on the progesterone, so we will wean this off. - estradiol (ESTRACE) 0.5 MG tablet; Take 1 tablet (0.5 mg total) by mouth daily.  Dispense: 90 tablet; Refill: 1  6. Insomnia, unspecified type She would like to try something else for sleep.  Daughter has had success with hydroxyzine.  Okay to try this. - hydrOXYzine (ATARAX/VISTARIL) 25 MG tablet; Take 1 tablet (25 mg total) by mouth 3 times/day as needed-between meals & bedtime.  Dispense: 60 tablet; Refill: 2  7. Need for immunization against influenza - Flu Vaccine QUAD 6+ mos PF IM (Fluarix Quad PF)   Return for pending bloodwork.  45 minutes spent with patient discussion of hormone replacement therapy, we reviewed different shoe types  for good support of feet and bunions, discussed treatment options for insomnia, chart review, exam, charting.      Micheline Rough, MD

## 2021-05-11 LAB — CBC WITH DIFFERENTIAL/PLATELET
Absolute Monocytes: 676 cells/uL (ref 200–950)
Basophils Absolute: 61 cells/uL (ref 0–200)
Basophils Relative: 0.8 %
Eosinophils Absolute: 144 cells/uL (ref 15–500)
Eosinophils Relative: 1.9 %
HCT: 40.1 % (ref 35.0–45.0)
Hemoglobin: 13.2 g/dL (ref 11.7–15.5)
Lymphs Abs: 1330 cells/uL (ref 850–3900)
MCH: 31.1 pg (ref 27.0–33.0)
MCHC: 32.9 g/dL (ref 32.0–36.0)
MCV: 94.6 fL (ref 80.0–100.0)
MPV: 11.8 fL (ref 7.5–12.5)
Monocytes Relative: 8.9 %
Neutro Abs: 5388 cells/uL (ref 1500–7800)
Neutrophils Relative %: 70.9 %
Platelets: 235 10*3/uL (ref 140–400)
RBC: 4.24 10*6/uL (ref 3.80–5.10)
RDW: 12.7 % (ref 11.0–15.0)
Total Lymphocyte: 17.5 %
WBC: 7.6 10*3/uL (ref 3.8–10.8)

## 2021-05-11 LAB — URINALYSIS W MICROSCOPIC + REFLEX CULTURE
Bacteria, UA: NONE SEEN /HPF
Bilirubin Urine: NEGATIVE
Glucose, UA: NEGATIVE
Hgb urine dipstick: NEGATIVE
Hyaline Cast: NONE SEEN /LPF
Ketones, ur: NEGATIVE
Leukocyte Esterase: NEGATIVE
Nitrites, Initial: NEGATIVE
Protein, ur: NEGATIVE
RBC / HPF: NONE SEEN /HPF (ref 0–2)
Specific Gravity, Urine: 1.011 (ref 1.001–1.035)
Squamous Epithelial / HPF: NONE SEEN /HPF (ref <=5)
WBC, UA: NONE SEEN /HPF (ref 0–5)
pH: 6 (ref 5.0–8.0)

## 2021-05-11 LAB — LIPID PANEL
Cholesterol: 250 mg/dL — ABNORMAL HIGH (ref <200)
HDL: 69 mg/dL (ref >=50)
LDL Cholesterol (Calc): 156 mg/dL (calc) — ABNORMAL HIGH
Non-HDL Cholesterol (Calc): 181 mg/dL (calc) — ABNORMAL HIGH (ref <130)
Total CHOL/HDL Ratio: 3.6 (calc) (ref <5.0)
Triglycerides: 129 mg/dL (ref <150)

## 2021-05-11 LAB — ACUTE HEP PANEL AND HEP B SURFACE AB
HEPATITIS C ANTIBODY REFILL$(REFL): NONREACTIVE
Hep A IgM: NONREACTIVE
Hep B C IgM: NONREACTIVE
Hepatitis B Surface Ag: NONREACTIVE
SIGNAL TO CUT-OFF: 0.58 (ref <1.00)

## 2021-05-11 LAB — TSH: TSH: 1.09 mIU/L

## 2021-05-11 LAB — COMPREHENSIVE METABOLIC PANEL
AG Ratio: 2 (calc) (ref 1.0–2.5)
ALT: 14 U/L (ref 6–29)
AST: 17 U/L (ref 10–35)
Albumin: 4.8 g/dL (ref 3.6–5.1)
Alkaline phosphatase (APISO): 86 U/L (ref 37–153)
BUN: 18 mg/dL (ref 7–25)
CO2: 23 mmol/L (ref 20–32)
Calcium: 10.1 mg/dL (ref 8.6–10.4)
Chloride: 102 mmol/L (ref 98–110)
Creat: 0.66 mg/dL (ref 0.50–1.03)
Globulin: 2.4 g/dL (calc) (ref 1.9–3.7)
Glucose, Bld: 90 mg/dL (ref 65–99)
Potassium: 4.5 mmol/L (ref 3.5–5.3)
Sodium: 138 mmol/L (ref 135–146)
Total Bilirubin: 0.5 mg/dL (ref 0.2–1.2)
Total Protein: 7.2 g/dL (ref 6.1–8.1)

## 2021-05-11 LAB — REFLEX TIQ

## 2021-05-11 LAB — NO CULTURE INDICATED

## 2021-05-11 LAB — HEPATITIS C ANTIBODY
Hepatitis C Ab: NONREACTIVE
SIGNAL TO CUT-OFF: 0.58 (ref <1.00)

## 2021-05-14 ENCOUNTER — Telehealth: Payer: Self-pay | Admitting: Family Medicine

## 2021-05-14 NOTE — Telephone Encounter (Signed)
PT called to advise that she would like a callback from [facility].

## 2021-05-14 NOTE — Telephone Encounter (Signed)
Opened in error - please disregard

## 2021-05-15 ENCOUNTER — Telehealth (INDEPENDENT_AMBULATORY_CARE_PROVIDER_SITE_OTHER): Payer: BC Managed Care – PPO | Admitting: Family Medicine

## 2021-05-15 ENCOUNTER — Encounter: Payer: Self-pay | Admitting: Family Medicine

## 2021-05-15 VITALS — Temp 102.1°F | Ht 67.0 in

## 2021-05-15 DIAGNOSIS — U071 COVID-19: Secondary | ICD-10-CM

## 2021-05-15 DIAGNOSIS — R059 Cough, unspecified: Secondary | ICD-10-CM

## 2021-05-15 MED ORDER — BENZONATATE 100 MG PO CAPS: 200.0000 mg | ORAL_CAPSULE | Freq: Two times a day (BID) | ORAL | 0 refills | Status: AC | PRN

## 2021-05-15 MED ORDER — ALBUTEROL SULFATE HFA 108 (90 BASE) MCG/ACT IN AERS
2.0000 | INHALATION_SPRAY | Freq: Four times a day (QID) | RESPIRATORY_TRACT | 0 refills | Status: DC | PRN
Start: 2021-05-15 — End: 2021-07-17

## 2021-05-15 MED ORDER — MOLNUPIRAVIR EUA 200MG CAPSULE: 4.0000 | ORAL_CAPSULE | Freq: Two times a day (BID) | ORAL | 0 refills | Status: AC

## 2021-05-15 NOTE — Progress Notes (Signed)
Virtual Visit via Video Note I connected with Lydia [name] on 05/15/21 by a video enabled telemedicine application and verified that I am speaking with the correct person using two identifiers.  Location patient: home Location provider:Home office Persons participating in the virtual visit: patient, provider  I discussed the limitations of evaluation and management by telemedicine and the availability of in person appointments. The patient expressed understanding and agreed to proceed.  Chief Complaint  Patient presents with   Covid Positive   HPI: Ms. Lydia [name] is a 55 yo female with hx of insomnia and chronic pain c/o 2-3 days of respiratory symptoms. Home COVID 19 positive test Wednesday. Fever, chills, fatigue,headache, sore throat, productive cough, and body aches.  Difficulty taking a deep breath, chest tightness with exertion. Productive cough with clear and sometimes yellowish sputum, negative for hemoptysis.  Max temp 103.8 F axillary.  Negative for anosmia,ageusia,stridor, wheezing, CP, palpitations, abdominal pain, nausea, vomiting, changes in bowel habits, urinary symptoms, edema,or skin rash.  She has taking home remedies, hot tea and honey. She takes ibuprofen for chronic pain. Symptoms are stable.  She was in contact with a person that was coughing and son with positive COVID 19 test. She has completed COVID 19 vaccination and has a booster.  ROS: See pertinent positives and negatives per HPI.  Past Medical History:  Diagnosis Date   Anxiety    Back pain    Colon polyps    Diverticulitis    Ehlers-Danlos syndrome type III    GERD (gastroesophageal reflux disease)    Hypertension    Migraines     Past Surgical History:  Procedure Laterality Date   ABDOMINAL HYSTERECTOMY  2012   menorrhagia; kept cervix/ovaries   BLADDER TUMOR EXCISION  2013    Family History  Problem Relation Age of Onset   Colon cancer Mother 31   Breast cancer Mother 68    Colon cancer Maternal Uncle 49   Heart disease Father    Aortic aneurysm Father 74   Aortic aneurysm Paternal Grandfather 19   Rectal cancer Neg Hx     Social History   Socioeconomic History   Marital status: Married    Spouse name: Not on file   Number of children: Not on file   Years of education: Not on file   Highest education level: Not on file  Occupational History   Not on file  Tobacco Use   Smoking status: Never   Smokeless tobacco: Never  Substance and Sexual Activity   Alcohol use: Yes    Alcohol/week: 21.0 standard drinks    Types: 21 Standard drinks or equivalent per week    Comment: occasionally    Drug use: No   Sexual activity: Yes  Other Topics Concern   Not on file  Social History Narrative   Not on file   Social Determinants of Health   Financial Resource Strain: Not on file  Food Insecurity: Not on file  Transportation Needs: Not on file  Physical Activity: Not on file  Stress: Not on file  Social Connections: Not on file  Intimate Partner Violence: Not on file   Current Outpatient Medications:    estradiol (ESTRACE) 0.5 MG tablet, Take 1 tablet (0.5 mg total) by mouth daily., Disp: 90 tablet, Rfl: 1   HYDROcodone-acetaminophen (NORCO/VICODIN) 5-325 MG per tablet, Take 1 tablet by mouth every 6 (six) hours as needed for moderate pain., Disp: , Rfl:    hydrOXYzine (ATARAX/VISTARIL) 25 MG tablet, Take 1  tablet (25 mg total) by mouth 3 times/day as needed-between meals & bedtime., Disp: 60 tablet, Rfl: 2   Ibuprofen (MOTRIN PO), Take by mouth., Disp: , Rfl:    Multiple Vitamins-Minerals (HAIR SKIN AND NAILS FORMULA PO), Take by mouth., Disp: , Rfl:    naproxen sodium (ALEVE) 220 MG tablet, Take 220 mg by mouth., Disp: , Rfl:    progesterone (PROMETRIUM) 100 MG capsule, Take 1-2 capsules (100-200 mg total) by mouth daily., Disp: 60 capsule, Rfl: 5   rizatriptan (MAXALT) 10 MG tablet, Take 10 mg by mouth as needed., Disp: , Rfl:    zolpidem (AMBIEN)  10 MG tablet, Take 10 mg by mouth at bedtime as needed for sleep., Disp: , Rfl:   EXAM:  VITALS per patient if applicable:Temp (!) 740.8 F (38.9 C) (Axillary)   Ht 5\' 7"  (1.702 m)   BMI 24.78 kg/m   GENERAL: alert, oriented, appears well and in no acute distress  HEENT: atraumatic, conjunctiva clear, no obvious abnormalities on inspection of external nose and ears Nasal congestion.  NECK: normal movements of the head and neck  LUNGS: on inspection no signs of respiratory distress, breathing rate appears normal, no obvious gross SOB, gasping or wheezing Productive cough a couple times during visit. CV: no obvious cyanosis  MS: moves all visible extremities without noticeable abnormality  PSYCH/NEURO: pleasant and cooperative, no obvious depression or anxiety, speech and thought processing grossly intact  ASSESSMENT AND PLAN:  Discussed the following assessment and plan:  COVID-19 virus infection - Plan: molnupiravir EUA (LAGEVRIO) 200 mg CAPS capsule, albuterol (VENTOLIN HFA) 108 (90 Base) MCG/ACT inhaler We discussed Dx,possible complications and treatment options. She has a moderate case with low risk for complications but I still recommend antiviral medication.  We discussed oral antiviral options and side effects.Initially reluctant to take antiviral medication. She would like to have Rx at her pharmacy and she will pick it up if she decides to take it in the next 48 hours, up to 5 days of symptoms onset. Symptomatic treatment with plenty of fluids,rest,tylenol 500 mg 3-4 times per day prn. Throat lozenges if needed for sore throat. Quarantine x 7 days , starting from symptoms onset. Albuterol inh 2 puff every 6 hours for a week then as needed for shortness of breath.   Clearly instructed about warning signs.  Cough - Plan: benzonatate (TESSALON) 100 MG capsule, DG Chest 2 View Explained that cough and congestion may last a few more days and even weeks after acute  symptoms have resolved. Symptomatic treatment with Benzonatate and adequate hydration recommended. CXR order placed, she can go Monday if not greatly improved.  We discussed possible serious and likely etiologies, options for evaluation and workup, limitations of telemedicine visit vs in person visit, treatment, treatment risks and precautions.  I discussed the assessment and treatment plan with the patient. The patient was provided an opportunity to ask questions and all were answered. The patient agreed with the plan and demonstrated an understanding of the instructions.  Return if symptoms worsen or fail to improve.  Kesi Perrow Martinique, MD

## 2021-05-18 ENCOUNTER — Telehealth: Payer: Self-pay | Admitting: Family Medicine

## 2021-05-18 NOTE — Telephone Encounter (Signed)
Pt call and stated she want a call back to tell her if she take her estrogen in the morning or at night.

## 2021-05-20 NOTE — Telephone Encounter (Signed)
Left a detailed message at the patient's cell number with the information below.   

## 2021-05-20 NOTE — Telephone Encounter (Signed)
Shouldn't matter. I would do what is easiest for her. Let me know if any negative side effects with medication - sometimes this will help [place] decide when to take medication (ie if causes a little stomach upset, then sometimes taking at bedtime helps avoid feeling side effect)

## 2021-06-03 ENCOUNTER — Telehealth: Payer: Self-pay | Admitting: Family Medicine

## 2021-06-03 NOTE — Telephone Encounter (Signed)
Patient informed of the message below.

## 2021-06-03 NOTE — Telephone Encounter (Signed)
Patient called stating that she had a virtual appointment with Dr. Martinique three weeks ago today, and she is still having some trouble with breathing and her heart racing.  She states that she was given an inhaler but is needing clarification on how many puffs she needs to take, and how often in the day she should use it. She also wants to know if the inhaler is supposed to help break up the congestion in her chest.  Patient also states that she was supposed to have a chest x-ray done and is wondering if she should have this done prior to scheduling a follow up visit with Dr. Ethlyn Gallery.   Patient says that she tested negative two days ago but still periodically has a low grade fever. She says it may be her thermometer, so she says that she is going to get a new one.  Patient was sent to triage nurse but wanted a message to be sent back to Dr. Ethlyn Gallery and her team.  Please advise.

## 2021-06-03 NOTE — Telephone Encounter (Signed)
*  I do recommend getting cxr completed now *ok to use albuterol 2 puffs every four hours as needed; would suggest mucinex to help break up congestion if needed  We can check in once she gets xray completed. Looks like f/u visit is with Dr. Martinique.

## 2021-06-05 ENCOUNTER — Ambulatory Visit (INDEPENDENT_AMBULATORY_CARE_PROVIDER_SITE_OTHER): Payer: BC Managed Care – PPO

## 2021-06-05 ENCOUNTER — Other Ambulatory Visit: Payer: Self-pay

## 2021-06-05 ENCOUNTER — Ambulatory Visit: Payer: BC Managed Care – PPO | Admitting: Family Medicine

## 2021-06-05 ENCOUNTER — Encounter: Payer: Self-pay | Admitting: Family Medicine

## 2021-06-05 VITALS — BP 124/80 | HR 77 | Temp 97.7°F | Resp 16 | Ht 67.0 in | Wt 159.5 lb

## 2021-06-05 DIAGNOSIS — R0789 Other chest pain: Secondary | ICD-10-CM | POA: Diagnosis not present

## 2021-06-05 DIAGNOSIS — R059 Cough, unspecified: Secondary | ICD-10-CM | POA: Diagnosis not present

## 2021-06-05 DIAGNOSIS — G4700 Insomnia, unspecified: Secondary | ICD-10-CM

## 2021-06-05 DIAGNOSIS — R002 Palpitations: Secondary | ICD-10-CM

## 2021-06-05 LAB — CBC
HCT: 41.4 % (ref 36.0–46.0)
Hemoglobin: 13.8 g/dL (ref 12.0–15.0)
MCHC: 33.2 g/dL (ref 30.0–36.0)
MCV: 94.4 fl (ref 78.0–100.0)
Platelets: 251 10*3/uL (ref 150.0–400.0)
RBC: 4.39 Mil/uL (ref 3.87–5.11)
RDW: 13.4 % (ref 11.5–15.5)
WBC: 5.7 10*3/uL (ref 4.0–10.5)

## 2021-06-05 LAB — TSH: TSH: 1.13 u[IU]/mL (ref 0.35–5.50)

## 2021-06-05 LAB — BASIC METABOLIC PANEL
BUN: 19 mg/dL (ref 6–23)
CO2: 29 mEq/L (ref 19–32)
Calcium: 10.4 mg/dL (ref 8.4–10.5)
Chloride: 100 mEq/L (ref 96–112)
Creatinine, Ser: 0.7 mg/dL (ref 0.40–1.20)
GFR: 98.81 mL/min (ref >60.00)
Glucose, Bld: 82 mg/dL (ref 70–99)
Potassium: 4.6 mEq/L (ref 3.5–5.1)
Sodium: 138 mEq/L (ref 135–145)

## 2021-06-05 LAB — D-DIMER, QUANTITATIVE: D-Dimer, Quant: 0.33 mcg/mL FEU (ref <0.50)

## 2021-06-05 NOTE — Patient Instructions (Addendum)
A few things to remember from today's visit:  Palpitations - Plan: Basic metabolic panel, CBC, D-dimer, Quantitative, TSH, TSH, D-dimer, Quantitative, CBC, Basic metabolic panel, EKG [id number]  Chest tightness  If you need refills please call your pharmacy. Do not use My Chart to request refills or for acute issues that need immediate attention.   I do not think you need to go to the ER at this time, monitor for new symptoms.  If any chest pain, palpitation with associated dizziness, or shortness of breath you need to seek immediate medical attention. Today we ordered some labs, we may need to call you to send you to the ER to have a chest CT to evaluate for blood clots if one of the test is positive.  If all labs and chest X ray are negative, we need to consider sending you back to cardiologist.  Please be sure medication list is accurate. If a new problem present, please set up appointment sooner than planned today.

## 2021-06-05 NOTE — Progress Notes (Signed)
ACUTE VISIT Chief Complaint  Patient presents with   Follow-up   HPI: [name] is a 54 y.o. female with history of constipation, GERD, anxiety, and chronic pain here today complaining of difficulty breathing and palpitations. She was seen on 05/15/21, virtual visit, with COVID 19 infection. Most of symptoms resolved. Fatigue has improved.  She did not complete antiviral treatment due to side effects, Paxlovid.  Reporting "tight chest, hard to inhale, and out of breath when having a conversation." Negative for lower extremity edema or erythema but sometimes she feels a warm sensation on left calf. She is checking pulse O2 at home: 93-97%.  Shortness of Breath This is a new problem. The current episode started 1 to 4 weeks ago. The problem occurs intermittently. The problem has been unchanged. Pertinent negatives include no abdominal pain, chest pain, claudication, fever, headaches, hemoptysis, leg pain, leg swelling, orthopnea, PND, rash, rhinorrhea, sore throat, sputum production, swollen glands, syncope, vomiting or wheezing. The symptoms are aggravated by any activity. Risk factors include oral contraceptive (On oral estradiol.). She has tried beta agonist inhalers for the symptoms. The treatment provided no relief. There is no history of chronic lung disease, COPD, DVT, PE or a recent surgery.   "A lot of palpitations, heart racing." Episodes last a few minutes. She has not identified exacerbating or alleviating factors.  She wonders if the symptoms could be caused by Albuterol inh, which she has not used for a couple days. Still having symptoms.  She has had palpitations in the past, did wear a heart monitor and Dx'ed with atrial fib. She had one similar episode a few days ago, she felt like skipping beats. No associated diaphoresis or dizziness.  She saw cardiologist about 12 years ago,took Atenol,and atrial fib resolved.  At the end of the visit she is also  asking about insomnia.  She was on Ambien, which is helped.  Because she wanted to try a different medication, her PCP recommended hydroxyzine, 25 mg has not helped.  She wonders if she can take a higher dose. She has not identified exacerbating factors.  Review of Systems  Constitutional:  Positive for activity change and fatigue. Negative for fever.  HENT:  Positive for postnasal drip. Negative for congestion, mouth sores, rhinorrhea and sore throat.   Respiratory:  Positive for cough (Occasionally) and shortness of breath. Negative for hemoptysis, sputum production and wheezing.   Cardiovascular:  Negative for chest pain, orthopnea, claudication, leg swelling, syncope and PND.  Gastrointestinal:  Negative for abdominal pain, nausea and vomiting.  Endocrine: Negative for cold intolerance and heat intolerance.  Genitourinary:  Negative for decreased urine volume, dysuria and hematuria.  Musculoskeletal:  Positive for arthralgias and myalgias.  Skin:  Negative for rash.  Neurological:  Negative for syncope, weakness and headaches.  Psychiatric/Behavioral:  Positive for sleep disturbance. Negative for confusion. The patient is nervous/anxious.   Rest see pertinent positives and negatives per HPI.  Current Outpatient Medications on File Prior to Visit  Medication Sig Dispense Refill   albuterol (VENTOLIN HFA) 108 (90 Base) MCG/ACT inhaler Inhale 2 puffs into the lungs every 6 (six) hours as needed for wheezing or shortness of breath. 8 g 0   estradiol (ESTRACE) 0.5 MG tablet Take 1 tablet (0.5 mg total) by mouth daily. 90 tablet 1   HYDROcodone-acetaminophen (NORCO/VICODIN) 5-325 MG per tablet Take 1 tablet by mouth every 6 (six) hours as needed for moderate pain.     hydrOXYzine (ATARAX/VISTARIL) 25 MG tablet Take  1 tablet (25 mg total) by mouth 3 times/day as needed-between meals & bedtime. 60 tablet 2   Ibuprofen (MOTRIN PO) Take by mouth.     Multiple Vitamins-Minerals (HAIR SKIN AND NAILS  FORMULA PO) Take by mouth.     naproxen sodium (ALEVE) 220 MG tablet Take 220 mg by mouth.     progesterone (PROMETRIUM) 100 MG capsule Take 1-2 capsules (100-200 mg total) by mouth daily. 60 capsule 5   rizatriptan (MAXALT) 10 MG tablet Take 10 mg by mouth as needed.     zolpidem (AMBIEN) 10 MG tablet Take 10 mg by mouth at bedtime as needed for sleep.     No current facility-administered medications on file prior to visit.   Past Medical History:  Diagnosis Date   Anxiety    Back pain    Colon polyps    Diverticulitis    Ehlers-Danlos syndrome type III    GERD (gastroesophageal reflux disease)    Hypertension    Migraines    Allergies  Allergen Reactions   Latex Other (See Comments)   Social History   Socioeconomic History   Marital status: Married    Spouse name: Not on file   Number of children: Not on file   Years of education: Not on file   Highest education level: Not on file  Occupational History   Not on file  Tobacco Use   Smoking status: Never   Smokeless tobacco: Never  Substance and Sexual Activity   Alcohol use: Yes    Alcohol/week: 21.0 standard drinks    Types: 21 Standard drinks or equivalent per week    Comment: occasionally    Drug use: No   Sexual activity: Yes  Other Topics Concern   Not on file  Social History Narrative   Not on file   Social Determinants of Health   Financial Resource Strain: Not on file  Food Insecurity: Not on file  Transportation Needs: Not on file  Physical Activity: Not on file  Stress: Not on file  Social Connections: Not on file   Vitals:   06/05/21 1433  BP: 124/80  Pulse: 77  Resp: 16  Temp: 97.7 F (36.5 C)  SpO2: 97%   Body mass index is 24.98 kg/m.  Physical Exam Vitals and nursing note reviewed.  Constitutional:      General: She is not in acute distress.    Appearance: She is well-developed.  HENT:     Head: Normocephalic and atraumatic.     Mouth/Throat:     Mouth: Mucous membranes are  moist.     Pharynx: Oropharynx is clear.  Eyes:     Conjunctiva/sclera: Conjunctivae normal.  Cardiovascular:     Rate and Rhythm: Normal rate and regular rhythm.     Pulses:          Dorsalis pedis pulses are 2+ on the right side and 2+ on the left side.     Heart sounds: No murmur heard.    Comments: Negative for calf pain with foot dorsiflexion or palpation, bilateral. Pulmonary:     Effort: Pulmonary effort is normal. No respiratory distress.     Breath sounds: Normal breath sounds.  Abdominal:     Palpations: Abdomen is soft. There is no hepatomegaly or mass.     Tenderness: There is no abdominal tenderness.  Lymphadenopathy:     Cervical: No cervical adenopathy.  Skin:    General: Skin is warm.     Findings: No erythema or  rash.  Neurological:     General: No focal deficit present.     Mental Status: She is alert and oriented to person, place, and time.     Cranial Nerves: No cranial nerve deficit.     Gait: Gait normal.  Psychiatric:     Comments: Well groomed, good eye contact.     ASSESSMENT AND PLAN:  Lydia [name] was seen today for follow-up.  Diagnoses and all orders for this visit: Orders Placed This Encounter  Procedures   Basic metabolic panel   CBC   D-dimer, Quantitative   TSH   EKG 12-Lead   Lab Results  Component Value Date   DDIMER 0.33 06/05/2021   Lab Results  Component Value Date   TSH 1.13 06/05/2021   Lab Results  Component Value Date   CREATININE 0.70 06/05/2021   BUN 19 06/05/2021   NA 138 06/05/2021   K 4.6 06/05/2021   CL 100 06/05/2021   CO2 29 06/05/2021   Lab Results  Component Value Date   WBC 5.7 06/05/2021   HGB 13.8 06/05/2021   HCT 41.4 06/05/2021   MCV 94.4 06/05/2021   PLT 251.0 06/05/2021   Palpitations We discussed possible etiologies. Heart auscultation today is normal. EKG today:SR,? LAE,normal axis and intervals. Unspecific T wave abnormalities on anterior leads. Compared with EKG done in 10/2013 inverted  T wave in V3 is now present. She had EKG done in 07/2020, c/o jaw pain and "flutters" at that time.I cannot see tracing but record there were T wave abnormalities in V1-2.  According to patient, she was referred to the ED, where work-up was negative, she was discharged home and referred to cardiologist. Further recommendations according to lab results, if work-up is normal and she continues having symptoms, cardiology evaluation will be necessary.  Chest tightness COVID-19 infection 3 weeks ago,we discussed possible complications, including thrombotic events. Examination today do not suggest a serious process. Explained that these symptoms could be related to recent COVID 19 infection,usually improve after a few weeks.  Chest x-ray ordered. EKG as described above. D-Dimer ordered stat. We discussed lab interpretation. Let her know that she will be contacted at any time if lab is abnormal, in which case she will be asked to go to the ER to obtain a chest CTA. She was clearly instructed about warning signs. She voices understanding and agrees with plan.  Insomnia, unspecified type She can try higher dose of hydroxyzine, increased from 25 mg to 50 mg. Good sleep hygiene. Follow-up with PCP to discuss other options.  I spent a total of 50 minutes in both face to face and non face to face activities for this visit on the date of this encounter. During this time history was obtained and documented, examination was performed, prior labs/records reviewed, and assessment/plan discussed.  Return in about 2 weeks (around 06/19/2021) for pcp.  Kathrine Rieves G. Martin[place], MD  [facility]. [place] office.

## 2021-07-06 ENCOUNTER — Telehealth: Payer: Self-pay

## 2021-07-06 NOTE — Telephone Encounter (Signed)
Patient called requesting a call back to discuss enlarged kidney and results from Dr. Nelva Bush

## 2021-07-07 ENCOUNTER — Encounter: Payer: Self-pay | Admitting: Family Medicine

## 2021-07-07 NOTE — Telephone Encounter (Signed)
Left a message requesting the patient call back and leave a detailed message as to what she is referring to and this can be forwarded to PCP.

## 2021-07-14 ENCOUNTER — Telehealth: Payer: Self-pay | Admitting: *Deleted

## 2021-07-14 DIAGNOSIS — M2550 Pain in unspecified joint: Secondary | ICD-10-CM | POA: Insufficient documentation

## 2021-07-14 DIAGNOSIS — M542 Cervicalgia: Secondary | ICD-10-CM | POA: Insufficient documentation

## 2021-07-14 NOTE — Telephone Encounter (Signed)
-----   Message from Caren Macadam, MD sent at 07/08/2021  4:44 PM EST ----- Patient reports "severely enlarged liver" noted on recent MRI from [facility], Dr. Nelva Bush. Can we get copy of this? She is very concerned about what follow up is needed and I would like to review these results so I know how to proceed. Thanks!

## 2021-07-14 NOTE — Telephone Encounter (Signed)
Spoke with Lydia [name] at [facility] and she stated the MRI of the spine will be faxed to our office.  Message sent to PCP.

## 2021-07-15 ENCOUNTER — Telehealth: Payer: Self-pay | Admitting: Family Medicine

## 2021-07-15 NOTE — Telephone Encounter (Signed)
noted 

## 2021-07-15 NOTE — Telephone Encounter (Signed)
A report was dropped off for Lydia [name] regarding a report that needed to be given to Dr.Koberlein before patient's virtual appointment on 12/02. Envelop has been placed in folder.

## 2021-07-15 NOTE — Telephone Encounter (Signed)
Thanks for dropping off that report which stated severe hepatomegaly. They suggested correlating with liver enzymes (bloodwork), but this was done in September and was normal. As long as she is not having any abdominal pain, I would suggest we refer her to GI for further discussion/evaluation. She did have history of hepatitis which we discussed at her last visit, so that would be the right specialist to take things from here. I do not feel that she needs to have additional bloodwork done at this time. Ok to place referral if she is in agreement.

## 2021-07-16 NOTE — Telephone Encounter (Signed)
Left a message for the patient to return my call.  

## 2021-07-17 ENCOUNTER — Encounter: Payer: Self-pay | Admitting: Family Medicine

## 2021-07-17 ENCOUNTER — Telehealth (INDEPENDENT_AMBULATORY_CARE_PROVIDER_SITE_OTHER): Payer: BC Managed Care – PPO | Admitting: Family Medicine

## 2021-07-17 VITALS — Wt 157.0 lb

## 2021-07-17 DIAGNOSIS — R160 Hepatomegaly, not elsewhere classified: Secondary | ICD-10-CM | POA: Diagnosis not present

## 2021-07-17 DIAGNOSIS — Q796 Ehlers-Danlos syndrome, unspecified: Secondary | ICD-10-CM

## 2021-07-17 DIAGNOSIS — R350 Frequency of micturition: Secondary | ICD-10-CM

## 2021-07-17 NOTE — Telephone Encounter (Signed)
Patient is scheduled for an appt today at 3pm.

## 2021-07-17 NOTE — Progress Notes (Signed)
Virtual Visit via Video Note  I connected with Lydia [name]  on 07/17/21 at  3:00 PM EST by a video enabled telemedicine application and verified that I am speaking with the correct person using two identifiers.  Location patient: home Location provider: [place], [place] [id number] Persons participating in the virtual visit: patient, provider  I discussed the limitations of evaluation and management by telemedicine and the availability of in person appointments. The patient expressed understanding and agreed to proceed.   Lydia [name] DOB: 1966/05/26 Encounter date: 07/17/2021  This is a 55 y.o. female who presents with Chief Complaint  Patient presents with   Results    Patient requests to discuss MRI results from [facility]    History of present illness:  She followed up with Dr. Nelva Bush just because she wanted to make sure he was up to date on everything. Low back L5-S1 has improved. All she knows is that pain started with low back; she had to use walker for 3 days to get out of bed. Pain, weakness was so significant. Got on prednisone and a few days later then felt like something happened with thoracic area. Like normal pain but amplified like 100%. Just felt like it was nerve endings on spine. Just felt burning that was bad left and right side. Just felt like nerve pain with the tickling/severe pain. Prednisone seemed to kick in. Baclofen seemed to help in combo with prednisone. Everything is bearable now, but something is not right. She is getting MRI of cervical/thoracic spine. Just knows that there is inflammation going on through thoracic spine.   MRI Lumbar spine completed with [facility] and severely enlarged liver was documented on this study. Ever since having COVID, she just hasn't been the same. Feels like covid caused some kind of inflammation. Had covid on 9/30. No abdominal pain. Bloodwork with normal hepatic  enzymes was 9/23, prior to covid infection. Has had some intermittent nausea. Prednisone making her hungry; eating "like a pig".   Feels like she needs a good rheumatologist to work with her. Saw rheumatology in 20's. Had done well for awhile after injections. 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 She was on viox in the past for back and it really helped her with her pain.   Does have GI doc - Dr. Henrene Pastor.   Not taking stool softeners, more constipated because of this. For last month, has noted that stool is light tan in color. Noted this for a month; but now normal.   Increased urination. Sometimes feels different with stinging with urination. Even without urination. 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   Has not heard that liver was enlarged before. Not had issues with liver enzyme elevation in the past. Was told by [facility] she had hep C; wasn't treated. When this was rechecked later this was negative.    Allergies  Allergen Reactions   Latex Other (See Comments)   Current Meds  Medication Sig   baclofen (LIORESAL) 10 MG tablet baclofen 10 mg tablet  TAKE 1 TABLET 3 TIMES A DAY BY ORAL ROUTE AS NEEDED.   HYDROcodone-acetaminophen (NORCO/VICODIN) 5-325 MG per tablet Take 1 tablet by mouth every 6 (six) hours as needed for moderate pain.   Ibuprofen (MOTRIN PO) Take by mouth.   Multiple Vitamins-Minerals (HAIR SKIN AND NAILS FORMULA PO) Take by mouth.   naproxen sodium (  ALEVE) 220 MG tablet Take 220 mg by mouth.   progesterone (PROMETRIUM) 100 MG capsule Take 1-2 capsules (100-200 mg total) by mouth daily. (Patient taking differently: Take 100-200 mg by mouth at bedtime.)   rizatriptan (MAXALT) 10 MG tablet Take 10 mg by mouth as needed.   zolpidem (AMBIEN) 10 MG tablet Take 10 mg by mouth at bedtime as needed for sleep.    Review of Systems  Constitutional:  Negative for chills, fatigue and fever.  Respiratory:  Negative for cough, chest  tightness, shortness of breath and wheezing.   Cardiovascular:  Negative for chest pain, palpitations and leg swelling.   Objective:  Wt 157 lb (71.2 kg)   BMI 24.59 kg/m   Weight: 157 lb (71.2 kg)   BP Readings from Last 3 Encounters:  06/05/21 124/80  05/08/21 110/80  11/14/20 128/70   Wt Readings from Last 3 Encounters:  07/17/21 157 lb (71.2 kg)  06/05/21 159 lb 8 oz (72.3 kg)  05/08/21 158 lb 3.2 oz (71.8 kg)    EXAM: Video feed not working.  GENERAL: alert, oriented, sounds well and in no acute distress LUNGS: no cough, shortness of breath noted. PSYCH/NEURO: pleasant and cooperative, no obvious depression or anxiety, speech and thought processing grossly intact   Assessment/Plan 1. Enlarged liver Our bloodwork was taken prior to her being sick and prior to abnormal imaging. She had some stool changes for a period of time as well. We will recheck to make sure that enzymes are normal. Further eval pending these results, but have asked her to call and make follow up appointment with GI.  - Hepatic function panel; Future  2. Urinary frequency - Urinalysis with Culture Reflex - Basic metabolic panel; Future  3. Ehlers-Danlos syndrome - Ambulatory referral to Rheumatology. She does not want pain medication, but does want to have a system of support for joint pain/problems as they arise.    Return for for bloodwork.    I discussed the assessment and treatment plan with the patient. The patient was provided an opportunity to ask questions and all were answered. The patient agreed with the plan and demonstrated an understanding of the instructions.   The patient was advised to call back or seek an in-person evaluation if the symptoms worsen or if the condition fails to improve as anticipated.  I provided 30 minutes of face-to-face time during this encounter.   Micheline Rough, MD

## 2021-07-20 ENCOUNTER — Telehealth: Payer: Self-pay | Admitting: *Deleted

## 2021-07-20 NOTE — Telephone Encounter (Signed)
-----   Message from Caren Macadam, MD sent at 07/17/2021  3:45 PM EST ----- Please call to set up lab visit

## 2021-07-20 NOTE — Telephone Encounter (Signed)
Left a detailed message for the patient to schedule a lab appt as below.

## 2021-07-21 ENCOUNTER — Other Ambulatory Visit (INDEPENDENT_AMBULATORY_CARE_PROVIDER_SITE_OTHER): Payer: BC Managed Care – PPO

## 2021-07-21 DIAGNOSIS — E785 Hyperlipidemia, unspecified: Secondary | ICD-10-CM

## 2021-07-21 DIAGNOSIS — R350 Frequency of micturition: Secondary | ICD-10-CM | POA: Diagnosis not present

## 2021-07-21 DIAGNOSIS — R160 Hepatomegaly, not elsewhere classified: Secondary | ICD-10-CM

## 2021-07-22 LAB — BASIC METABOLIC PANEL
BUN: 16 mg/dL (ref 6–23)
CO2: 30 mEq/L (ref 19–32)
Calcium: 10.2 mg/dL (ref 8.4–10.5)
Chloride: 101 mEq/L (ref 96–112)
Creatinine, Ser: 0.7 mg/dL (ref 0.40–1.20)
GFR: 98.72 mL/min (ref >60.00)
Glucose, Bld: 89 mg/dL (ref 70–99)
Potassium: 4.5 mEq/L (ref 3.5–5.1)
Sodium: 137 mEq/L (ref 135–145)

## 2021-07-22 LAB — LIPID PANEL
Cholesterol: 284 mg/dL — ABNORMAL HIGH (ref 0–200)
HDL: 83.1 mg/dL (ref >39.00)
LDL Cholesterol: 168 mg/dL — ABNORMAL HIGH (ref 0–99)
NonHDL: 200.67
Total CHOL/HDL Ratio: 3
Triglycerides: 164 mg/dL — ABNORMAL HIGH (ref 0.0–149.0)
VLDL: 32.8 mg/dL (ref 0.0–40.0)

## 2021-07-22 LAB — HEPATIC FUNCTION PANEL
ALT: 18 U/L (ref 0–35)
AST: 21 U/L (ref 0–37)
Albumin: 4.7 g/dL (ref 3.5–5.2)
Alkaline Phosphatase: 88 U/L (ref 39–117)
Bilirubin, Direct: 0.1 mg/dL (ref 0.0–0.3)
Total Bilirubin: 0.4 mg/dL (ref 0.2–1.2)
Total Protein: 7.8 g/dL (ref 6.0–8.3)

## 2021-07-24 ENCOUNTER — Telehealth: Payer: Self-pay

## 2021-07-24 NOTE — Telephone Encounter (Signed)
Noted  

## 2021-07-24 NOTE — Telephone Encounter (Signed)
Patient returned call and was informed of results verbalized understanding

## 2021-07-25 NOTE — Telephone Encounter (Signed)
Results note was sent; patient spoke with Lydia [name]. Liver enzymes all normal.

## 2021-08-18 ENCOUNTER — Other Ambulatory Visit: Payer: Self-pay

## 2021-08-18 ENCOUNTER — Ambulatory Visit: Payer: BC Managed Care – PPO | Admitting: Nurse Practitioner

## 2021-08-18 DIAGNOSIS — F419 Anxiety disorder, unspecified: Secondary | ICD-10-CM | POA: Insufficient documentation

## 2021-08-18 DIAGNOSIS — Q796 Ehlers-Danlos syndrome, unspecified: Secondary | ICD-10-CM | POA: Insufficient documentation

## 2021-08-18 DIAGNOSIS — G43909 Migraine, unspecified, not intractable, without status migrainosus: Secondary | ICD-10-CM | POA: Insufficient documentation

## 2021-08-18 NOTE — Progress Notes (Deleted)
08/18/2021 Lydia [name] [id number] Jan 18, 1966   Chief Complaint: Enlarged liver   History of Present Illness: Lydia [name] is a 56 year old female with a past medical history of anxiety, migraine headaches, chronic back pain on narcotics, Ehlers-Danlos syndrome, palpitations, GERD, chronic constipation and tubular adenomatous colon polyps. She is followed by Dr. Henrene Pastor.   Colon cancer (age of onset: 68) in her mother; Colon cancer (age of onset: 26) in her maternal uncle; Heart disease in her father.  Seen by cardiology due to having palpitation. Given palpitations, will get Zio monitor for 14 days and TTE to assess for any structural abnormalities.    CBC Latest Ref Rng & Units 06/05/2021 05/08/2021 03/28/2020  WBC 4.0 - 10.5 K/uL 5.7 7.6 6.6  Hemoglobin 12.0 - 15.0 g/dL 13.8 13.2 14.3  Hematocrit 36.0 - 46.0 % 41.4 40.1 42.6  Platelets 150.0 - 400.0 K/uL 251.0 235 261    CMP Latest Ref Rng & Units 07/21/2021 06/05/2021 05/08/2021  Glucose 70 - 99 mg/dL 89 82 90  BUN 6 - 23 mg/dL 16 19 18   Creatinine 0.40 - 1.20 mg/dL 0.70 0.70 0.66  Sodium 135 - 145 mEq/L 137 138 138  Potassium 3.5 - 5.1 mEq/L 4.5 4.6 4.5  Chloride 96 - 112 mEq/L 101 100 102  CO2 19 - 32 mEq/L 30 29 23   Calcium 8.4 - 10.5 mg/dL 10.2 10.4 10.1  Total Protein 6.0 - 8.3 g/dL 7.8 - 7.2  Total Bilirubin 0.2 - 1.2 mg/dL 0.4 - 0.5  Alkaline Phos 39 - 117 U/L 88 - -  AST 0 - 37 U/L 21 - 17  ALT 0 - 35 U/L 18 - 14     EGD 08/07/2019:  - Normal esophagus. - Normal stomach. - Normal examined duodenum. Biopsied.  Colonoscopy 12/22/20220:  - The examined portion of the ileum was normal. - Melanosis in the colon. - Two 1 to 3 mm polyps in the ascending colon and in the cecum, removed with a cold snare. Resected and retrieved. - Diverticulosis in the sigmoid colon and in the ascending colon. - The examination was otherwise normal on direct and retroflexion views. - 5 year colonoscopy recall  Path  Report: 1. Surgical [P], colon, ascending and cecum, polyp (2) - TUBULAR ADENOMA (X2 FRAGMENTS). - NO HIGH GRADE DYSPLASIA OR MALIGNANCY. 2. Surgical [P], duodenal - BENIGN SMALL BOWEL MUCOSA. - NO VILLOUS BLUNTING OR INCREASE IN INTRAEPITHELIAL LYMPHOCYTES. - NO DYSPLASIA OR MALIGNANCY  Current Medications, Allergies, Past Medical History, Past Surgical History, Family History and Social History were reviewed in [employer] [employer] record.   Review of Systems:   Constitutional: Negative for fever, sweats, chills or weight loss.  Respiratory: Negative for shortness of breath.   Cardiovascular: Negative for chest pain, palpitations and leg swelling.  Gastrointestinal: See HPI.  Musculoskeletal: Negative for back pain or muscle aches.  Neurological: Negative for dizziness, headaches or paresthesias.    Physical Exam: There were no vitals taken for this visit. General: Well developed, w   ***female in no acute distress. Head: Normocephalic and atraumatic. Eyes: No scleral icterus. Conjunctiva pink . Ears: Normal auditory acuity. Mouth: Dentition intact. No ulcers or lesions.  Lungs: Clear throughout to auscultation. Heart: Regular rate and rhythm, no murmur. Abdomen: Soft, nontender and nondistended. No masses or hepatomegaly. Normal bowel sounds x 4 quadrants.  Rectal: *** Musculoskeletal: Symmetrical with no gross deformities. Extremities: No edema. Neurological: Alert oriented x 4. No focal deficits.  Psychological: Alert and cooperative. Normal mood and affect  Assessment and Recommendations: ***

## 2021-09-26 ENCOUNTER — Encounter: Payer: Self-pay | Admitting: Family Medicine

## 2021-12-06 ENCOUNTER — Other Ambulatory Visit: Payer: Self-pay | Admitting: Family Medicine

## 2021-12-07 NOTE — Telephone Encounter (Signed)
Spoke with the patient and she stated she did not discontinue the medication as she was told this will help with sleep.  Per patient's request, an appt was scheduled for 5/10 to discuss the medication.  Message sent to PCP. ?

## 2021-12-07 NOTE — Telephone Encounter (Signed)
Pharmacy req for progesterone refill. I thought we had patient stop this. There is really no benefit of progresterone to her post menopause since she has had a hysterectomy. Let me know if still requesting this. ?

## 2021-12-08 NOTE — Telephone Encounter (Signed)
i'll refill it so she doesn't run out, but it really shouldn't help with sleep. It's only indication is really to control vaginal bleeding and thickness of uterine lining. The estrogen should help with hot flashes, hormonal regulation, which tends to be more of an aid for sleep. She can always just try and see how she does off the progesterone and if it seems that it helps her in some way, she can stay on it, but there are a lot of potential side effects with medication, so if not serving a purpose it's good to simplify. We can discuss further at scheduled visit if she wants. ?

## 2021-12-10 NOTE — Telephone Encounter (Signed)
Left a detailed message at the patient's cell number with the information below.   

## 2021-12-10 NOTE — Telephone Encounter (Signed)
Left a message for the patient to return my call.  

## 2021-12-15 ENCOUNTER — Encounter: Payer: Self-pay | Admitting: Internal Medicine

## 2021-12-15 ENCOUNTER — Ambulatory Visit: Payer: BC Managed Care – PPO | Admitting: Internal Medicine

## 2021-12-15 VITALS — BP 142/100 | HR 76 | Ht 67.0 in | Wt 159.0 lb

## 2021-12-15 DIAGNOSIS — R160 Hepatomegaly, not elsewhere classified: Secondary | ICD-10-CM | POA: Diagnosis not present

## 2021-12-15 NOTE — Progress Notes (Signed)
HISTORY OF PRESENT ILLNESS: ? ?Lydia [name] is a 56 y.o. female with a history of Ehlers-Danlos syndrome, migraine headaches, anxiety, and chronic back pain.  Patient self-referred today regarding an MRI of the lumbar spine which was said to show severe hepatomegaly. ? ?Patient was last seen in this office November 2020 regarding narcotic induced constipation, abdominal cramping, GERD, and a family history of colon cancer.  See that dictation for details.  She subsequently underwent colonoscopy and upper endoscopy August 07, 2019.  Colonoscopy revealed diminutive colon polyps, diverticulosis, and melanosis coli.  The ileum was normal.  Upper endoscopy was normal.  The polyps were adenomatous.  Due to family history, follow-up in 5 years recommended. ? ?Patient has been having trouble with her back.  She saw Dr. Herma Mering.  MRI of the spine was obtained July 01, 2021.  This was said to show "severe hepatomegaly".  Liver tests from September and December 2022 were entirely normal.  Patient reports her mother with hepatitis C.  Otherwise no family history of liver disease.  She has no referable complaints the she states that she feels her liver is enlarged. ? ?REVIEW OF SYSTEMS: ? ?All non-GI ROS negative unless otherwise stated in the HPI except for back pain ? ?Past Medical History:  ?Diagnosis Date  ? Anxiety   ? Back pain   ? Colon polyps   ? Diverticulitis   ? Ehlers-Danlos syndrome type III   ? GERD (gastroesophageal reflux disease)   ? Hypertension   ? Migraines   ? ? ?Past Surgical History:  ?Procedure Laterality Date  ? ABDOMINAL HYSTERECTOMY  2012  ? menorrhagia; kept cervix/ovaries  ? BLADDER TUMOR EXCISION  2013  ? ? ?Social History ?Lydia [name]  reports that she has never smoked. She has never used smokeless tobacco. She reports current alcohol use of about 21.0 standard drinks per week. She reports that she does not use drugs. ? ?family history includes Aortic aneurysm (age of onset:  20) in her father; Aortic aneurysm (age of onset: 66) in her paternal grandfather; Breast cancer (age of onset: 109) in her mother; Colon cancer (age of onset: 29) in her mother; Colon cancer (age of onset: 63) in her maternal uncle; Heart disease in her father. ? ?Allergies  ?Allergen Reactions  ? Latex Other (See Comments)  ? ? ?  ? ?PHYSICAL EXAMINATION: ?Vital signs: BP (!) 142/100   Pulse 76   Ht '5\' 7"'$  (1.702 m)   Wt 159 lb (72.1 kg)   BMI 24.90 kg/m?   ?Constitutional: generally well-appearing, no acute distress ?Psychiatric: alert and oriented x3, cooperative ?Eyes: extraocular movements intact, anicteric, conjunctiva pink ?Mouth: oral pharynx moist, no lesions ?Neck: supple no lymphadenopathy ?Cardiovascular: heart regular rate and rhythm, no murmur ?Lungs: clear to auscultation bilaterally ?Abdomen: soft, nontender, nondistended, no obvious ascites, no peritoneal signs, normal bowel sounds, no organomegaly.  Liver is normal in the mid and left portions.  She may have a Riedel's lobe palpated laterally ?Rectal: Omitted ?Extremities: no clubbing, cyanosis, lower extremity edema bilaterally ?Skin: no lesions on visible extremities ?Neuro: No focal deficits. No asterixis.  ? ? ? ?ASSESSMENT: ? ?1.  Hepatomegaly on spinal imaging.  Suspect Riedel's lobe.  Normal liver tests ?2.  Personal history of adenomatous colon polyps and family history of colon cancer.  Last colonoscopy December 2020 ?3.  General medical problems ? ? ?PLAN: ? ?1.  Contrast-enhanced CT of the abdomen pelvis.  Evaluate hepatomegaly.  Rule out Riedel's lobe  or other process.  We will contact the patient if the results are available ?2.  Surveillance colonoscopy around December 2025 ?A total time of 30 minutes was spent preparing to see the patient, reviewing outside laboratories and x-rays, obtaining history, performing medically appropriate physical examination, counseling and educating the patient regarding the above listed issues,  ordering advanced radiology study, and documenting clinical information in the health record ? ? ? ?  ?

## 2021-12-15 NOTE — Patient Instructions (Signed)
If you are age 54 or older, your body mass index should be between 23-30. Your Body mass index is 24.9 kg/m?Marland Kitchen If this is out of the aforementioned range listed, please consider follow up with your Primary Care Provider. ? ?If you are age 62 or younger, your body mass index should be between 19-25. Your Body mass index is 24.9 kg/m?Marland Kitchen If this is out of the aformentioned range listed, please consider follow up with your Primary Care Provider.  ? ?________________________________________________________ ? ?The [facility] providers would like to encourage you to use [facility] to communicate with providers for non-urgent requests or questions.  Due to long hold times on the telephone, sending your provider a message by [facility] may be a faster and more efficient way to get a response.  Please allow 48 business hours for a response.  Please remember that this is for non-urgent requests.  ?_______________________________________________________ ? ?You have been scheduled for a CT scan of the abdomen and pelvis at [facility] ([address]---this is in the same building as [employer]).  ? ?You are scheduled on _______ at ___________. You should arrive 15 minutes prior to your appointment time for registration. Please follow the written instructions below on the day of your exam: ? ?WARNING: IF YOU ARE ALLERGIC TO IODINE/X-RAY DYE, PLEASE NOTIFY RADIOLOGY IMMEDIATELY AT [phone]! YOU WILL BE GIVEN A 13 HOUR PREMEDICATION PREP. ? ?1) Do not eat or drink anything after ________ (4 hours prior to your test) ?2) You have been given 2 bottles of oral contrast to drink. The solution may taste better if refrigerated, but do NOT add ice or any other liquid to this solution. Shake well before drinking. ?  ? Drink 1 bottle of contrast @ _______ (2 hours prior to your exam) ? Drink 1 bottle of contrast @ _______ (1 hour prior to your exam) ? ?You may take any medications as prescribed with a small amount of  water, if necessary. If you take any of the following medications: METFORMIN, GLUCOPHAGE, GLUCOVANCE, AVANDAMET, RIOMET, FORTAMET, ACTOPLUS MET, JANUMET, [place] or METAGLIP, you MAY be asked to HOLD this medication 48 hours AFTER the exam. ? ?The purpose of you drinking the oral contrast is to aid in the visualization of your intestinal tract. The contrast solution may cause some diarrhea. Depending on your individual set of symptoms, you may also receive an intravenous injection of x-ray contrast/dye. Plan on being at [facility] for 30 minutes or longer, depending on the type of exam you are having performed. ? ?This test typically takes 30-45 minutes to complete. ? ?If you have any questions regarding your exam or if you need to reschedule, you may call the CT department at [phone] between the hours of 8:00 am and 5:00 pm, Monday-Friday. ? ?________________________________________________________________________ ? ? ?

## 2021-12-17 ENCOUNTER — Telehealth: Payer: Self-pay

## 2021-12-17 NOTE — Telephone Encounter (Signed)
Lm on vm that patients ct was scheduled for 12/23/21 at 3:00pm ?

## 2021-12-23 ENCOUNTER — Ambulatory Visit: Payer: BC Managed Care – PPO | Admitting: Family Medicine

## 2021-12-23 ENCOUNTER — Ambulatory Visit (INDEPENDENT_AMBULATORY_CARE_PROVIDER_SITE_OTHER)
Admission: RE | Admit: 2021-12-23 | Discharge: 2021-12-23 | Disposition: A | Discharge status: Home / Self Care | Payer: BC Managed Care – PPO | Source: Ambulatory Visit | Attending: Internal Medicine | Admitting: Internal Medicine

## 2021-12-23 DIAGNOSIS — R160 Hepatomegaly, not elsewhere classified: Secondary | ICD-10-CM | POA: Diagnosis not present

## 2021-12-23 MED ORDER — IOHEXOL 300 MG/ML  SOLN
100.0000 mL | Freq: Once | INTRAMUSCULAR | Status: AC | PRN
  Administered 2021-12-23: 100 mL via INTRAVENOUS

## 2021-12-24 ENCOUNTER — Encounter: Payer: Self-pay | Admitting: Internal Medicine

## 2021-12-25 ENCOUNTER — Encounter: Payer: Self-pay | Admitting: Internal Medicine

## 2021-12-28 ENCOUNTER — Ambulatory Visit: Payer: BC Managed Care – PPO | Admitting: Family Medicine

## 2021-12-28 ENCOUNTER — Other Ambulatory Visit: Payer: Self-pay

## 2021-12-28 DIAGNOSIS — R160 Hepatomegaly, not elsewhere classified: Secondary | ICD-10-CM

## 2022-01-06 ENCOUNTER — Telehealth: Payer: Self-pay | Admitting: Internal Medicine

## 2022-01-06 NOTE — Telephone Encounter (Signed)
Patient called back stating she was calling in regards to blood work. Please give a call back to advise .  Thank you

## 2022-01-06 NOTE — Telephone Encounter (Signed)
Pt never returned the call. Will await further communication from the pt.      Left message for pt to call back.

## 2022-03-26 ENCOUNTER — Other Ambulatory Visit: Payer: Self-pay | Admitting: *Deleted

## 2022-03-26 MED ORDER — PROGESTERONE MICRONIZED 100 MG PO CAPS: 100.0000 mg | ORAL_CAPSULE | Freq: Every day | ORAL | 0 refills | Status: DC

## 2022-04-02 ENCOUNTER — Ambulatory Visit: Payer: BC Managed Care – PPO | Admitting: Family Medicine

## 2022-04-14 NOTE — Telephone Encounter (Signed)
Pt called back asking about labs that she needed to have drawn. DIscussed with her she was supposed to have an OV also. Pt scheduled to see Dr. Henrene Pastor 05/26/22 at 3pm. She will come a week prior to appt for labs, she is aware of OV appt.

## 2022-04-26 ENCOUNTER — Ambulatory Visit (INDEPENDENT_AMBULATORY_CARE_PROVIDER_SITE_OTHER): Payer: BC Managed Care – PPO | Admitting: Family Medicine

## 2022-04-26 ENCOUNTER — Encounter: Payer: Self-pay | Admitting: Family Medicine

## 2022-04-26 VITALS — BP 118/82 | HR 70 | Temp 96.7°F | Ht 67.0 in | Wt 156.9 lb

## 2022-04-26 DIAGNOSIS — F909 Attention-deficit hyperactivity disorder, unspecified type: Secondary | ICD-10-CM | POA: Insufficient documentation

## 2022-04-26 DIAGNOSIS — G43909 Migraine, unspecified, not intractable, without status migrainosus: Secondary | ICD-10-CM | POA: Diagnosis not present

## 2022-04-26 DIAGNOSIS — F902 Attention-deficit hyperactivity disorder, combined type: Secondary | ICD-10-CM

## 2022-04-26 MED ORDER — SUMATRIPTAN SUCCINATE 100 MG PO TABS: 100.0000 mg | ORAL_TABLET | ORAL | 5 refills | Status: AC | PRN

## 2022-04-26 MED ORDER — AMITRIPTYLINE HCL 25 MG PO TABS: 25.0000 mg | ORAL_TABLET | Freq: Every day | ORAL | 1 refills | Status: DC

## 2022-04-26 MED ORDER — BUPROPION HCL ER (XL) 150 MG PO TB24: 150.0000 mg | ORAL_TABLET | Freq: Every day | ORAL | 1 refills | Status: DC

## 2022-04-26 NOTE — Progress Notes (Unsigned)
Established Patient Office Visit  Subjective   Patient ID: Lydia [name], female    DOB: Nov 26, 1965  Age: 56 y.o. MRN: [id number]  Chief Complaint  Patient presents with   Establish Care    Pt is here for transition of care visit.  Pt reports that she has had some changes -- reports her rizatriptan is no longer working for her. States that she spoke with her pain management physician and she had an MRI recently also. Pt reports that lately her migraines are more frequent over the past 2 months. She received samples of Ubrelvy and nurtec without much improvement. States that   Patient reports that she was diagnosed with ADHD years ago, states that she used to be on wellbutrin in the past and states that she thought that maybe it did work for her in the past.     {History (Optional):23778}  Review of Systems  All other systems reviewed and are negative.     Objective:     BP 118/82 (BP Location: Left Arm, Patient Position: Sitting, Cuff Size: Normal)   Pulse 70   Temp (!) 96.7 F (35.9 C) (Axillary)   Ht '5\' 7"'$  (1.702 m)   Wt 156 lb 14.4 oz (71.2 kg)   SpO2 98%   BMI 24.57 kg/m  {Vitals History (Optional):23777}  Physical Exam Vitals reviewed.  Constitutional:      Appearance: Normal appearance. She is well-groomed and normal weight.  HENT:     Head: Normocephalic and atraumatic.     Mouth/Throat:     Mouth: Mucous membranes are moist.     Pharynx: Oropharynx is clear.  Eyes:     Extraocular Movements: Extraocular movements intact.     Conjunctiva/sclera: Conjunctivae normal.     Pupils: Pupils are equal, round, and reactive to light.  Cardiovascular:     Rate and Rhythm: Normal rate and regular rhythm.     Pulses: Normal pulses.     Heart sounds: S1 normal and S2 normal.  Pulmonary:     Effort: Pulmonary effort is normal.     Breath sounds: Normal breath sounds and air entry.  Abdominal:     General: Abdomen is flat. Bowel sounds are normal.      Palpations: Abdomen is soft.  Musculoskeletal:        General: Normal range of motion.     Cervical back: Normal range of motion and neck supple.     Right lower leg: No edema.     Left lower leg: No edema.  Skin:    General: Skin is warm and dry.  Neurological:     Mental Status: She is alert and oriented to person, place, and time. Mental status is at baseline.     Gait: Gait is intact.  Psychiatric:        Mood and Affect: Mood and affect normal.        Speech: Speech normal.        Behavior: Behavior normal.        Judgment: Judgment normal.      No results found for any visits on 04/26/22.  {Labs (Optional):23779}  The 10-year ASCVD risk score (Arnett DK, et al., 2019) is: 1.5%    Assessment & Plan:   Problem List Items Addressed This Visit       Cardiovascular and Mediastinum   Migraine syndrome - Primary   Relevant Medications   HYDROcodone-acetaminophen (NORCO) 10-325 MG tablet   amitriptyline (ELAVIL) 25 MG tablet  buPROPion (WELLBUTRIN XL) 150 MG 24 hr tablet   SUMAtriptan (IMITREX) 100 MG tablet   Other Relevant Orders   Ambulatory referral to Neurology     Other   ADHD   Relevant Medications   buPROPion (WELLBUTRIN XL) 150 MG 24 hr tablet    Return in about 3 months (around 07/26/2022) for follow up migraines and anxiety/ADHD.    Farrel Conners, MD

## 2022-04-28 NOTE — Assessment & Plan Note (Signed)
Riztriptan no longer effective, pt was given samples of ubrelvy and nurtec without any relief. We discussed adding amitriptyline at night for migraine prevention and will switch her to sumatriptan 100 mg for acute migraine. I also placed a referral to neurology for her severe migraines.

## 2022-04-28 NOTE — Assessment & Plan Note (Signed)
Patient is reporting this diagnosis, she exhibits pressured speech and easy distractability in the visit today. Will restart the bupropion at 150 mg daily and see her back in 3 months for follow up.

## 2022-05-11 ENCOUNTER — Encounter: Payer: Self-pay | Admitting: Family Medicine

## 2022-05-20 ENCOUNTER — Encounter: Payer: Self-pay | Admitting: Neurology

## 2022-05-26 ENCOUNTER — Ambulatory Visit: Payer: BC Managed Care – PPO | Admitting: Internal Medicine

## 2022-05-26 ENCOUNTER — Other Ambulatory Visit (INDEPENDENT_AMBULATORY_CARE_PROVIDER_SITE_OTHER): Payer: BC Managed Care – PPO

## 2022-05-26 DIAGNOSIS — R160 Hepatomegaly, not elsewhere classified: Secondary | ICD-10-CM

## 2022-05-26 LAB — COMPREHENSIVE METABOLIC PANEL
ALT: 15 U/L (ref 0–35)
AST: 15 U/L (ref 0–37)
Albumin: 4.6 g/dL (ref 3.5–5.2)
Alkaline Phosphatase: 91 U/L (ref 39–117)
BUN: 13 mg/dL (ref 6–23)
CO2: 28 mEq/L (ref 19–32)
Calcium: 10 mg/dL (ref 8.4–10.5)
Chloride: 103 mEq/L (ref 96–112)
Creatinine, Ser: 0.67 mg/dL (ref 0.40–1.20)
GFR: 99.18 mL/min (ref >60.00)
Glucose, Bld: 92 mg/dL (ref 70–99)
Potassium: 4.3 mEq/L (ref 3.5–5.1)
Sodium: 138 mEq/L (ref 135–145)
Total Bilirubin: 0.5 mg/dL (ref 0.2–1.2)
Total Protein: 7.6 g/dL (ref 6.0–8.3)

## 2022-05-26 LAB — PROTIME-INR
INR: 0.9 ratio (ref 0.8–1.0)
Prothrombin Time: 9.9 s (ref 9.6–13.1)

## 2022-05-26 LAB — CBC WITH DIFFERENTIAL/PLATELET
Basophils Absolute: 0.1 10*3/uL (ref 0.0–0.1)
Basophils Relative: 1 % (ref 0.0–3.0)
Eosinophils Absolute: 0.2 10*3/uL (ref 0.0–0.7)
Eosinophils Relative: 3.8 % (ref 0.0–5.0)
HCT: 39.9 % (ref 36.0–46.0)
Hemoglobin: 13.5 g/dL (ref 12.0–15.0)
Lymphocytes Relative: 25.4 % (ref 12.0–46.0)
Lymphs Abs: 1.4 10*3/uL (ref 0.7–4.0)
MCHC: 33.9 g/dL (ref 30.0–36.0)
MCV: 94.5 fl (ref 78.0–100.0)
Monocytes Absolute: 0.5 10*3/uL (ref 0.1–1.0)
Monocytes Relative: 9.6 % (ref 3.0–12.0)
Neutro Abs: 3.2 10*3/uL (ref 1.4–7.7)
Neutrophils Relative %: 60.2 % (ref 43.0–77.0)
Platelets: 235 10*3/uL (ref 150.0–400.0)
RBC: 4.22 Mil/uL (ref 3.87–5.11)
RDW: 12.5 % (ref 11.5–15.5)
WBC: 5.4 10*3/uL (ref 4.0–10.5)

## 2022-06-10 ENCOUNTER — Encounter: Payer: Self-pay | Admitting: Family Medicine

## 2022-06-10 ENCOUNTER — Ambulatory Visit: Payer: BC Managed Care – PPO | Admitting: Family Medicine

## 2022-06-10 VITALS — BP 124/80 | HR 72 | Temp 97.9°F | Ht 67.0 in | Wt 159.4 lb

## 2022-06-10 DIAGNOSIS — E559 Vitamin D deficiency, unspecified: Secondary | ICD-10-CM

## 2022-06-10 DIAGNOSIS — Z6824 Body mass index (BMI) 24.0-24.9, adult: Secondary | ICD-10-CM | POA: Diagnosis not present

## 2022-06-10 DIAGNOSIS — E785 Hyperlipidemia, unspecified: Secondary | ICD-10-CM

## 2022-06-10 DIAGNOSIS — E782 Mixed hyperlipidemia: Secondary | ICD-10-CM | POA: Diagnosis not present

## 2022-06-10 MED ORDER — VITAMIN D3 25 MCG (1000 UT) PO CAPS: 1000.0000 [IU] | ORAL_CAPSULE | Freq: Every day | ORAL | 5 refills | Status: AC

## 2022-06-10 MED ORDER — PHENTERMINE HCL 8 MG PO TABS: 1.0000 | ORAL_TABLET | Freq: Every day | ORAL | 0 refills | Status: AC

## 2022-06-10 NOTE — Progress Notes (Signed)
Established Patient Office Visit  Subjective   Patient ID: Lydia [name], female    DOB: July 29, 1966  Age: 56 y.o. MRN: [id number]  Chief Complaint  Patient presents with   Hypertension    Patient states she was seen at an urgent care 2 weeks ago and blood pressure was 175/110    Results    Patient requests to discuss abnormal lab test results from urgent care 2 weeks ago    Patient went to urgent care for a med check. Patient states at that visit her BP of 175. Patient states she has a history of HTN and was on atenolol in the past. Has been off of it for 5 years. States that when she is checking her BP at home it is sometimes elevated.  BP recheck in the office today and it was still WNL.   Patient states that she went to [place] to the weight loss center. States that she had blood work done there and her "MPV" was elevated. States that she would also like to have her cholesterol rechecked as well. Patient would also like to restart the medication she was given at the weight loss center due to weight gain. She states that she usually does not weigh this much and wants something that will control her weight. I reviewed past weights in the computer, she has not changed weight in the last 1 1/2 years.    Current Outpatient Medications  Medication Instructions   eszopiclone (LUNESTA) 2 MG TABS tablet SMARTSIG:1 Tablet(s) By Mouth Every Evening   HYDROcodone-acetaminophen (NORCO) 10-325 MG tablet TAKE 1 TABLETS BY MOUTH 4 TIMES A DAY AS NEEDED FOR PAIN   naproxen sodium (ALEVE) 220 mg, Oral   Phentermine HCl 8 MG TABS 1 tablet, Oral, Daily   pregabalin (LYRICA) 50 mg, Oral, 3 times daily   progesterone (PROMETRIUM) 100-200 mg, Oral, Daily   SUMAtriptan (IMITREX) 100 mg, Oral, Every 2 hours PRN, May repeat in 2 hours if headache persists or recurs.   Vitamin D3 1,000 Units, Oral, Daily     Patient Active Problem List   Diagnosis Date Noted   HLD (hyperlipidemia)  06/10/2022   Vitamin D deficiency 06/10/2022   ADHD 04/26/2022   Anxiety 08/18/2021   Ehlers-Danlos syndrome 08/18/2021   Migraine syndrome 08/18/2021   Multiple joint pain 07/14/2021   Neck pain 07/14/2021   Pain of right forearm 08/28/2020   Fatigue 05/07/2020   Hypercalcemia 05/07/2020   Osteopenia of neck of left femur 05/07/2020   Stress fracture of right tibia 02/11/2020   Pain in left knee 06/08/2019   Chronic constipation 01/04/2018   Chronic low back pain 09/02/2017   History of hysterectomy 07/23/2014   Family history of colon cancer 11/07/2013   GERD (gastroesophageal reflux disease) 11/07/2013   Abdominal pain, epigastric 11/07/2013   Unspecified constipation 11/07/2013      Review of Systems  All other systems reviewed and are negative.     Objective:     BP 124/80 (BP Location: Left Arm, Cuff Size: Normal)   Pulse 72   Temp 97.9 F (36.6 C) (Oral)   Ht '5\' 7"'$  (1.702 m)   Wt 159 lb 6.4 oz (72.3 kg)   SpO2 95%   BMI 24.97 kg/m    Physical Exam Vitals reviewed.  Constitutional:      Appearance: Normal appearance. She is well-groomed and normal weight.  Eyes:     Conjunctiva/sclera: Conjunctivae normal.  Neck:  Thyroid: No thyromegaly.  Cardiovascular:     Rate and Rhythm: Normal rate and regular rhythm.     Pulses: Normal pulses.     Heart sounds: S1 normal and S2 normal.  Pulmonary:     Effort: Pulmonary effort is normal.     Breath sounds: Normal breath sounds and air entry.  Abdominal:     General: Bowel sounds are normal.  Musculoskeletal:     Right lower leg: No edema.     Left lower leg: No edema.  Neurological:     Mental Status: She is alert and oriented to person, place, and time. Mental status is at baseline.     Gait: Gait is intact.  Psychiatric:        Mood and Affect: Mood and affect normal.        Speech: Speech normal.        Behavior: Behavior normal.        Judgment: Judgment normal.      No results found for  any visits on 06/10/22.    The 10-year ASCVD risk score (Arnett DK, et al., 2019) is: 1.7%    Assessment & Plan:   Problem List Items Addressed This Visit       Other   HLD (hyperlipidemia) (Chronic)    Will recheck lipid panel today      Relevant Orders   Lipid Panel   Vitamin D deficiency (Chronic)    Patient would like an rx for her vitamin D supplements, will place order.      Relevant Medications   Cholecalciferol (VITAMIN D3) 25 MCG (1000 UT) CAPS   Other Visit Diagnoses     BMI 24.0-24.9, adult    -  Primary   Relevant Medications   Patient's BMI is in the normal range and her weight has been stable for >1 year. We discussed that at her current BMI is may not be advisable to use weight loss medication. I will give her 1 month supply of the medication. If she wishes to continue the medication then she may return to the weight loss center for management. Phentermine HCl 8 MG TABS       No follow-ups on file.    Farrel Conners, MD

## 2022-06-11 ENCOUNTER — Telehealth: Payer: Self-pay | Admitting: *Deleted

## 2022-06-11 NOTE — Telephone Encounter (Signed)
[facility] faxed a note stating the Rx for Lomaira '8mg'$  is not covered-Phentermine 37.'5mg'$  and '30mg'$  are covered alternatives.  Message sent to PCP.

## 2022-06-15 NOTE — Assessment & Plan Note (Signed)
Will recheck lipid panel today

## 2022-06-15 NOTE — Assessment & Plan Note (Signed)
Patient would like an rx for her vitamin D supplements, will place order.

## 2022-06-15 NOTE — Telephone Encounter (Signed)
Left a detailed message with the information below at the patient's cell number and requested she call back with her preference.

## 2022-06-15 NOTE — Telephone Encounter (Signed)
Pt wanted the lower dose because she was worried about worsening her anxiety-- if she wants I can give her the 37.5 gm dose and she can cut them in half- please ask pt what she would like to do

## 2022-07-04 ENCOUNTER — Other Ambulatory Visit: Payer: Self-pay | Admitting: Family Medicine

## 2022-07-30 ENCOUNTER — Ambulatory Visit: Payer: BC Managed Care – PPO | Admitting: Family Medicine

## 2022-08-02 ENCOUNTER — Other Ambulatory Visit (INDEPENDENT_AMBULATORY_CARE_PROVIDER_SITE_OTHER): Payer: BC Managed Care – PPO

## 2022-08-02 ENCOUNTER — Telehealth: Payer: Self-pay | Admitting: Family Medicine

## 2022-08-02 DIAGNOSIS — E782 Mixed hyperlipidemia: Secondary | ICD-10-CM

## 2022-08-02 NOTE — Telephone Encounter (Signed)
Patient came in for her lab appointment. When patient was called to lab she was confused on why it was just a lipid panel. Patient came to the front and stated that she was under the impression that she would have more labs drawn today besides a lipid. After reaching out to CMA, I was instructed to take a message. Patient was notified that she would only have lipid done today but she could ask Dr.Michael about any lab work she wanted added when came in for her appointment on the 21st. Patient stated this was ridiculous. Patient then had lab tech ask if other providers were accepting new patients because she was unhappy with PCP. When informed that the providers she wanted were not, and sometimes certain labs are not ordered due to insurance coverage, patient became upset. After tech walked away to get supervisor, patient walked outside. Supervisor came into front office, asked me what was going on and while explaining the situation, patient came back in [place] and stated that we could throw out her labs and that she was "done with this place." Supervisor instructed me to remove the PCP from her chart and cancel her appointment on the 21st.      FYI

## 2022-08-03 LAB — LIPID PANEL
Cholesterol: 276 mg/dL — ABNORMAL HIGH (ref 0–200)
HDL: 89.4 mg/dL (ref >39.00)
LDL Cholesterol: 163 mg/dL — ABNORMAL HIGH (ref 0–99)
NonHDL: 186.12
Total CHOL/HDL Ratio: 3
Triglycerides: 115 mg/dL (ref 0.0–149.0)
VLDL: 23 mg/dL (ref 0.0–40.0)

## 2022-08-03 NOTE — Telephone Encounter (Signed)
Noted, ok to close message.

## 2022-08-05 ENCOUNTER — Ambulatory Visit: Payer: BC Managed Care – PPO | Admitting: Family Medicine

## 2022-09-23 IMAGING — CT CT ABD-PELV W/ CM
2 of 5 series · 15 of 46 positions shown, 17 images · IV contrast (OMNIPAQUE 300)
Comparison: None Available.

CLINICAL DATA: Enlarged liver on lumbar spine MR. No history of
primary malignancy. No liver disease. Twenty-one drinks of alcohol
per week per EMR.

EXAM:
CT ABDOMEN AND PELVIS WITH CONTRAST
TECHNIQUE: Multidetector CT imaging of the abdomen and pelvis was performed
using the standard protocol following bolus administration of
intravenous contrast.

[Series 2: abd/pel w · axial · 0.70mm/px · z∈[-511,-111]mm · 12 of 90 slices shown, 14 images]
[im 5/90  soft-tissue]
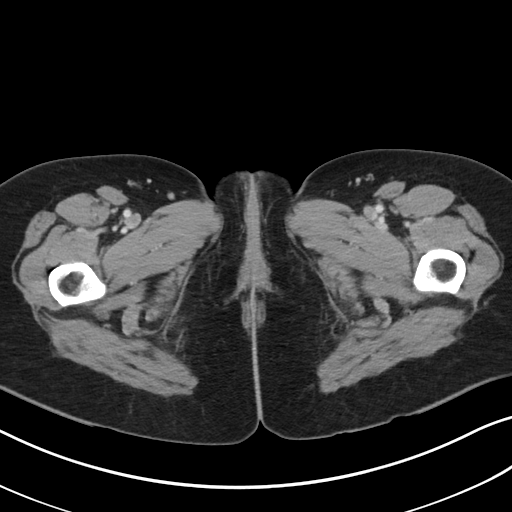
[im 5/90  bone]
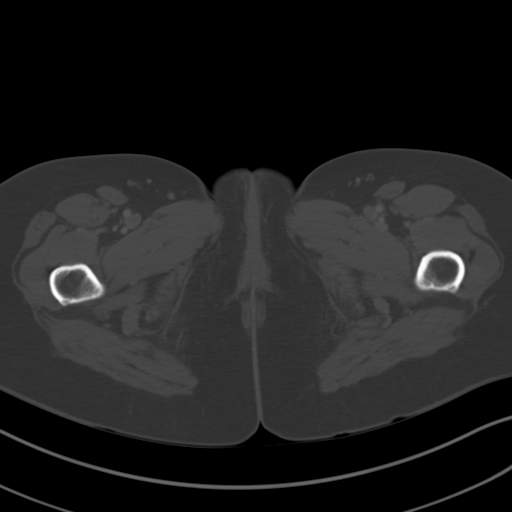
[im 15/90  soft-tissue]
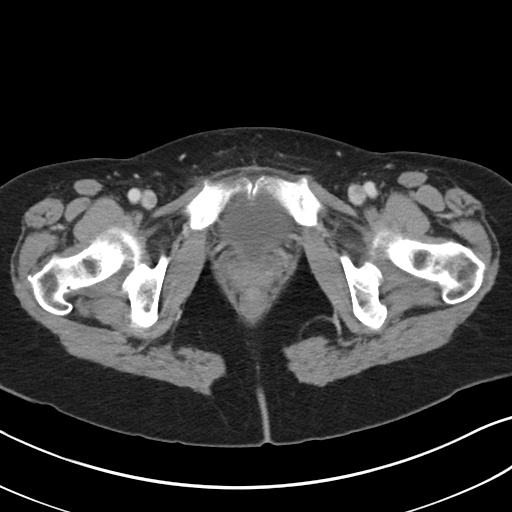
[im 19/90  soft-tissue]
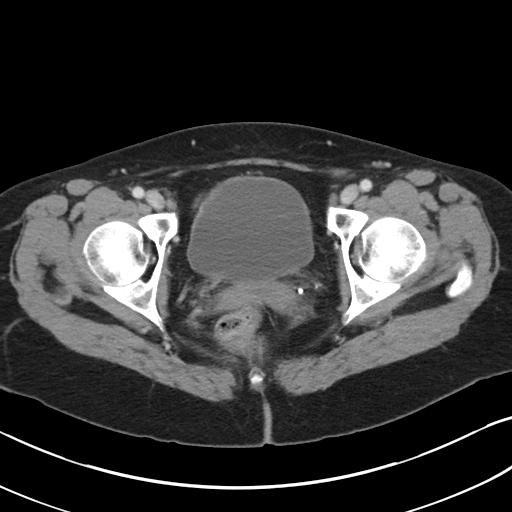
[im 29/90  soft-tissue]
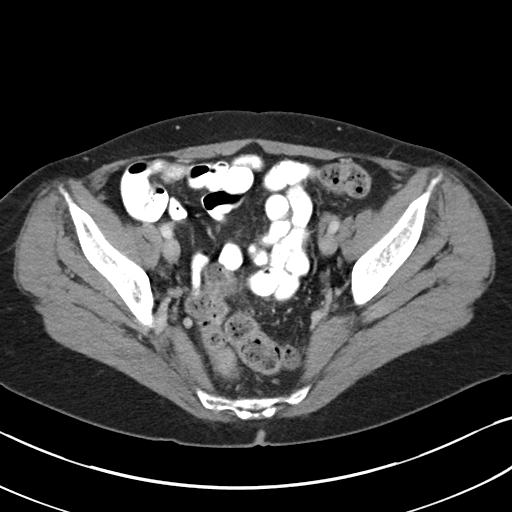
[im 33/90  soft-tissue]
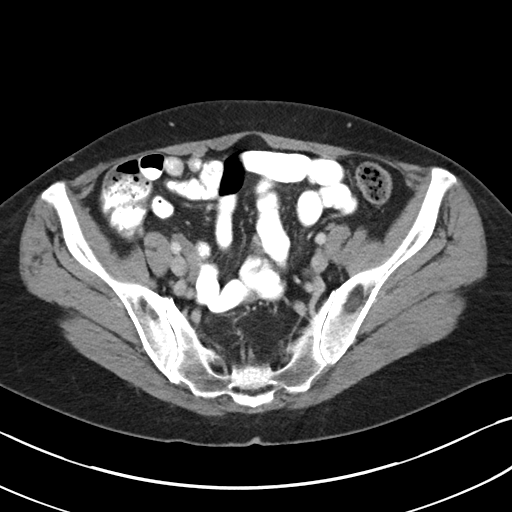
[im 43/90  soft-tissue]
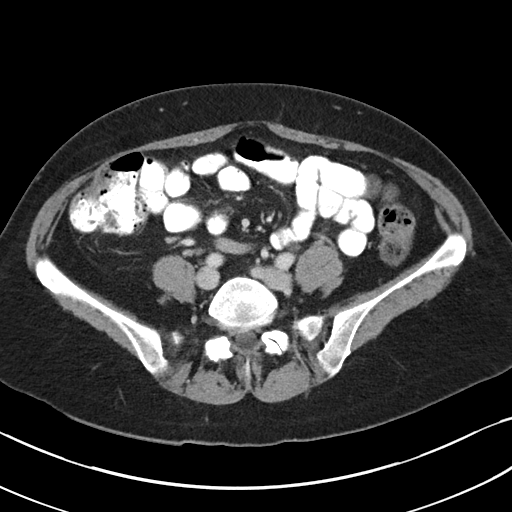
[im 47/90  soft-tissue]
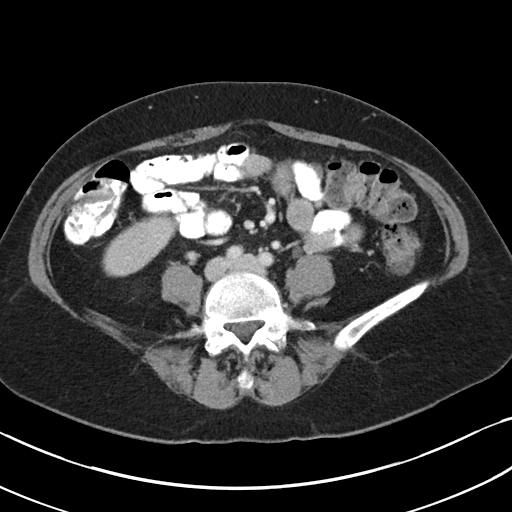
[im 57/90  soft-tissue]
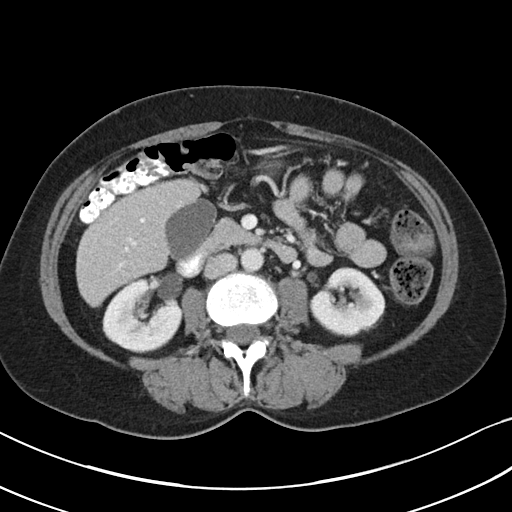
[im 61/90  soft-tissue]
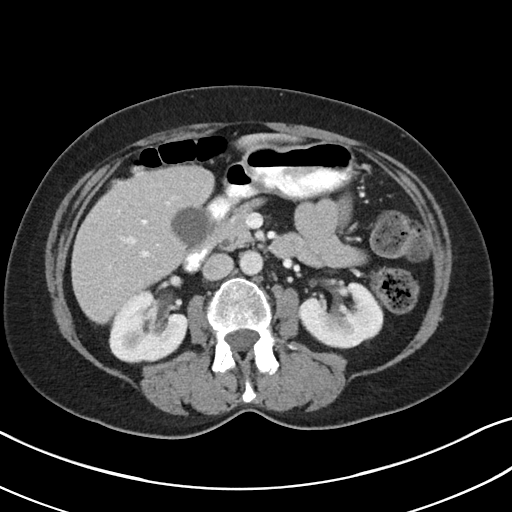
[im 61/90  bone]
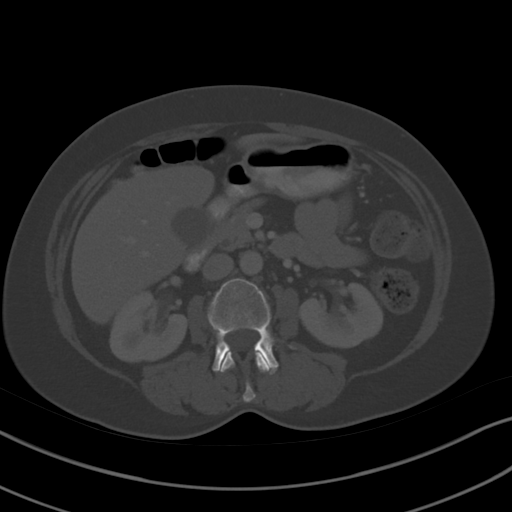
[im 71/90  soft-tissue]
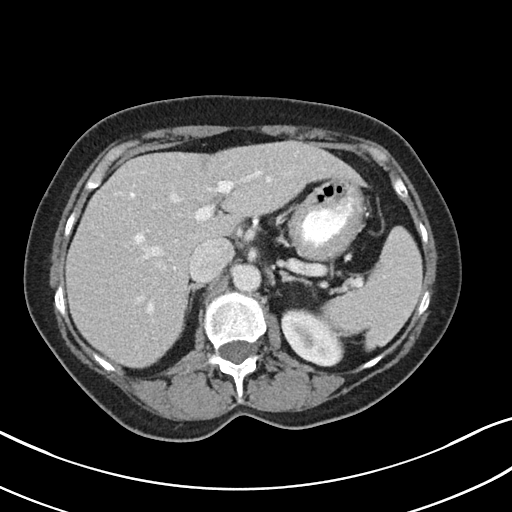
[im 75/90  soft-tissue]
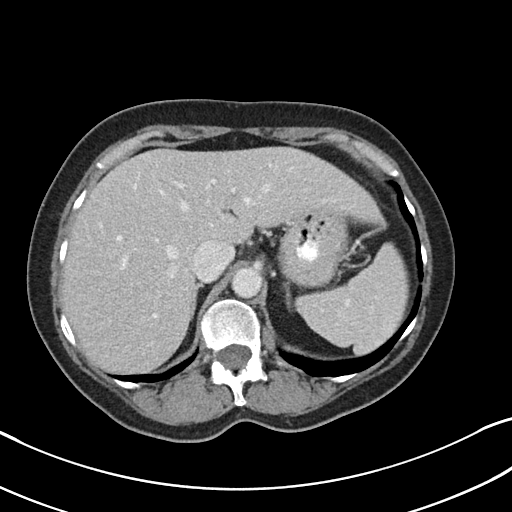
[im 85/90  soft-tissue]
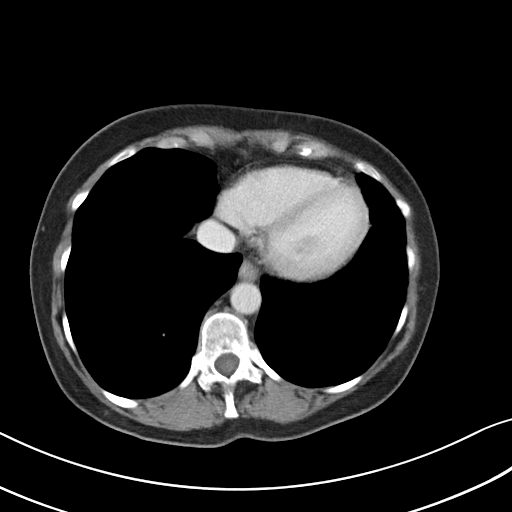

[Series 5: coronal st · coronal · 0.63mm/px · 3 of 76 slices shown]
[im 26/76  soft-tissue]
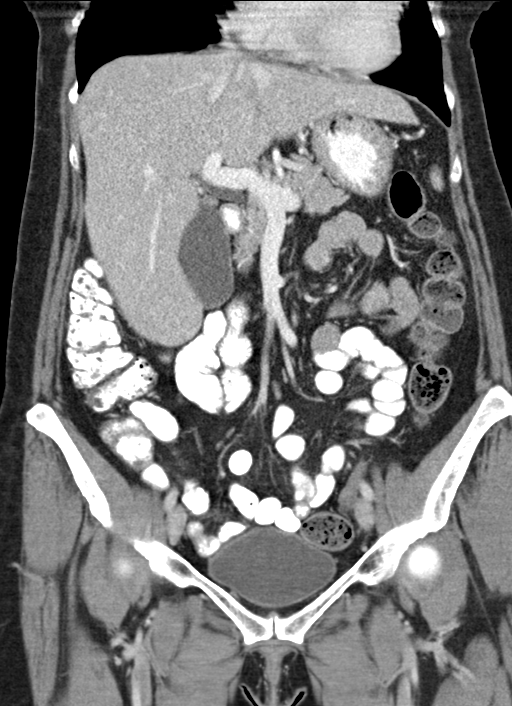
[im 34/76  soft-tissue]
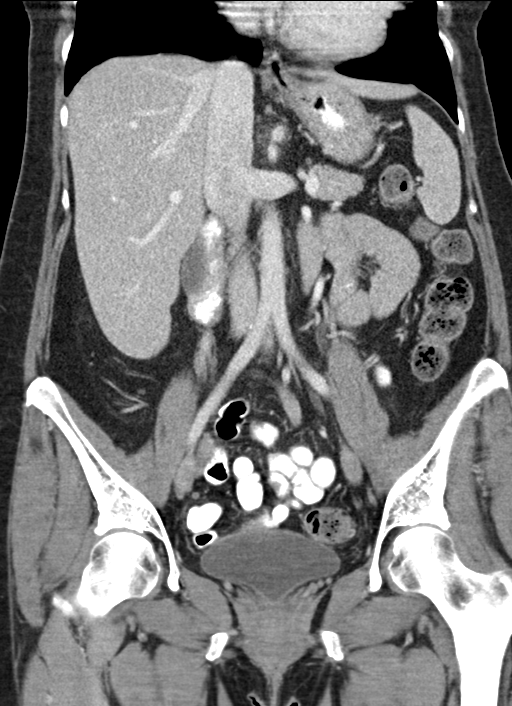
[im 42/76  soft-tissue]
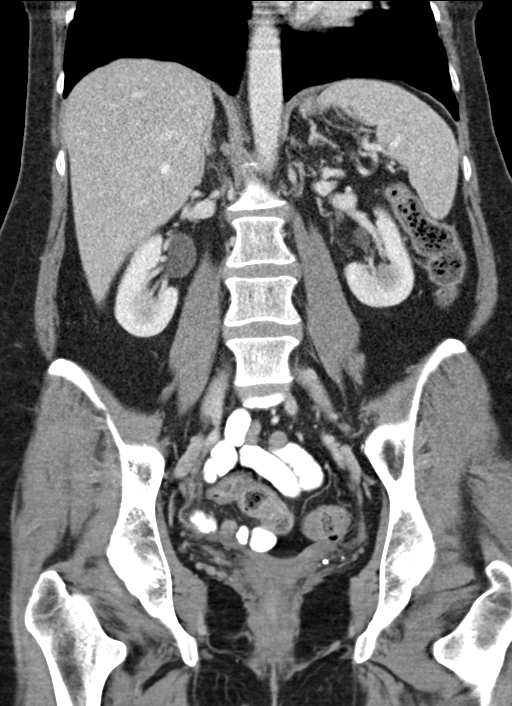

[15 of 46 positions shown; findings below may reference images not displayed]

RADIATION DOSE REDUCTION: This exam was performed according to the
departmental dose-optimization program which includes automated
exposure control, adjustment of the mA and/or kV according to
patient size and/or use of iterative reconstruction technique.

CONTRAST:  100mL OMNIPAQUE IOHEXOL 300 MG/ML  SOLN
FINDINGS: Lower chest: Mild left base scarring. Normal heart size without
pericardial or pleural effusion.

Hepatobiliary: Hepatomegaly at 19.1 cm cranial caudal. Tiny segment
2 cyst. Pericholecystic too small to characterize right liver lobe
lesion.

Segment 3 11 mm hypoattenuating lesion on [DATE] has a suggestion of
peripheral nodular enhancement and is less distinct on delayed
images, favoring a hemangioma.

Mild lateral segment left lobe prominence. Normal gallbladder,
without biliary ductal dilatation.

Pancreas: Normal, without mass or ductal dilatation.

Spleen: Normal in size, without focal abnormality.

Adrenals/Urinary Tract: Normal adrenal glands. Normal kidneys,
without hydronephrosis. Normal urinary bladder.

Stomach/Bowel: Normal stomach, without wall thickening. Normal
colon, appendix, and terminal ileum. Normal small bowel.

Vascular/Lymphatic: Normal caliber of the aorta and branch vessels.
No evidence of portal venous hypertension. No abdominopelvic
adenopathy.

Reproductive: Hysterectomy.  No adnexal mass.

Other: No significant free fluid.

Musculoskeletal: No acute osseous abnormality.
IMPRESSION: 1. Hepatomegaly of 19.1 cm favored over Riedel's lobe.
2. Prominent lateral segment left liver lobe, with a morphology that
can be seen in mild cirrhosis. Appearance is nonspecific but
clinical correlation suggested in this patient with a clinical
history of 21 drinks of alcohol per week. Consider ultrasound
elastography.
3. Segment 3 hepatic lesion is favored to represent a hemangioma. If
the patient is eventually diagnosed with cirrhosis, MRI follow-up at
6 months could be performed to confirm stability.
4.  No acute process in the abdomen or pelvis.

## 2022-10-10 NOTE — Progress Notes (Deleted)
NEUROLOGY CONSULTATION NOTE  Lydia [name] MRN: [id number] DOB: ***  Referring provider: *** Primary care provider: ***  Reason for consult:  ***  Assessment/Plan:   ***   Subjective:  Lydia [name] is a 57 year old female with Ehlers-Danlos, chronic pain and hypertension who presents for migraines.  History supplemented by referring provider's note.  Onset:  *** which became more frequent and refractory to treatment beginning over the summer 2023.   Location:  *** Quality:  *** Intensity:  ***.   Aura:  *** Prodrome:  *** Associated symptoms:  ***.  She denies associated unilateral numbness or weakness. Duration:  *** Frequency:  *** Frequency of abortive medication: *** Triggers:  *** Relieving factors:  *** Activity:  ***  Past NSAIDS/analgesics:  ibuprofen, tramadol Past abortive triptans:  rizatriptan, sumatriptan [place] Past abortive ergotamine:  *** Past muscle relaxants:  baclofen Past anti-emetic:  Reglan Past antihypertensive medications:  atenolol Past antidepressant medications:  amitriptyline, Wellbutrin Past anticonvulsant medications:  gabapentin Past anti-CGRP:  Roselyn Meier, Nurtec Past vitamins/Herbal/Supplements:  *** Past antihistamines/decongestants:  *** Other past therapies:  ***  Current NSAIDS/analgesics:  naproxen, Norco Current triptans:  sumatriptan '100mg'$  Current ergotamine:  none Current anti-emetic:  none Current muscle relaxants:  none Current Antihypertensive medications:  none Current Antidepressant medications:  none Current Anticonvulsant medications:  Lyrica '50mg'$  tid Current anti-CGRP:  none Current Vitamins/Herbal/Supplements:  Ca, D Current Antihistamines/Decongestants:  *** Other therapy:  ***   Caffeine:  *** Alcohol:  *** Smoker:  *** Diet:  *** Exercise:  *** Depression:  ***; Anxiety:  *** Other pain:  *** Sleep hygiene:  *** Family history of headache:  ***      PAST MEDICAL HISTORY: Past Medical  History:  Diagnosis Date   Anxiety    Back pain    Colon polyps    Diverticulitis    Ehlers-Danlos syndrome type III    GERD (gastroesophageal reflux disease)    Hypertension    Migraines     PAST SURGICAL HISTORY: Past Surgical History:  Procedure Laterality Date   ABDOMINAL HYSTERECTOMY  2012   menorrhagia; kept cervix/ovaries   BLADDER TUMOR EXCISION  2013    MEDICATIONS: Current Outpatient Medications on File Prior to Visit  Medication Sig Dispense Refill   Cholecalciferol (VITAMIN D3) 25 MCG (1000 UT) CAPS Take 1 capsule (1,000 Units total) by mouth daily. 30 capsule 5   eszopiclone (LUNESTA) 2 MG TABS tablet SMARTSIG:1 Tablet(s) By Mouth Every Evening     HYDROcodone-acetaminophen (NORCO) 10-325 MG tablet TAKE 1 TABLETS BY MOUTH 4 TIMES A DAY AS NEEDED FOR PAIN     naproxen sodium (ALEVE) 220 MG tablet Take 220 mg by mouth.     Phentermine HCl 8 MG TABS Take 1 tablet by mouth daily. 30 tablet 0   pregabalin (LYRICA) 50 MG capsule Take 50 mg by mouth 3 (three) times daily.     progesterone (PROMETRIUM) 100 MG capsule TAKE 1-2 CAPSULES (100-200 MG TOTAL) BY MOUTH DAILY. 180 capsule 1   SUMAtriptan (IMITREX) 100 MG tablet Take 1 tablet (100 mg total) by mouth every 2 (two) hours as needed for migraine. May repeat in 2 hours if headache persists or recurs. 10 tablet 5   No current facility-administered medications on file prior to visit.    ALLERGIES: No Active Allergies  FAMILY HISTORY: Family History  Problem Relation Age of Onset   Colon cancer Mother 48   Breast cancer Mother 27   Colon cancer Maternal  Uncle 40   Heart disease Father    Aortic aneurysm Father 24   Aortic aneurysm Paternal Grandfather 46   Rectal cancer Neg Hx     Objective:  *** General: No acute distress.  Patient appears well-groomed.   Head:  Normocephalic/atraumatic Eyes:  fundi examined but not visualized Neck: supple, no paraspinal tenderness, full range of motion Back: No  paraspinal tenderness Heart: regular rate and rhythm Lungs: Clear to auscultation bilaterally. Vascular: No carotid bruits. Neurological Exam: Mental status: alert and oriented to person, place, and time, speech fluent and not dysarthric, language intact. Cranial nerves: CN I: not tested CN II: pupils equal, round and reactive to light, visual fields intact CN III, IV, VI:  full range of motion, no nystagmus, no ptosis CN V: facial sensation intact. CN VII: upper and lower face symmetric CN VIII: hearing intact CN IX, X: gag intact, uvula midline CN XI: sternocleidomastoid and trapezius muscles intact CN XII: tongue midline Bulk & Tone: normal, no fasciculations. Motor:  muscle strength 5/5 throughout Sensation:  Pinprick, temperature and vibratory sensation intact. Deep Tendon Reflexes:  2+ throughout,  toes downgoing.   Finger to nose testing:  Without dysmetria.   Heel to shin:  Without dysmetria.   Gait:  Normal station and stride.  Romberg negative.    Thank you for allowing me to take part in the care of this patient.  Metta Clines, DO  CC: Loralyn Freshwater, MD

## 2022-10-11 ENCOUNTER — Ambulatory Visit: Payer: BC Managed Care – PPO | Admitting: Neurology

## 2022-10-11 ENCOUNTER — Encounter: Payer: Self-pay | Admitting: Neurology

## 2022-10-11 DIAGNOSIS — Z029 Encounter for administrative examinations, unspecified: Secondary | ICD-10-CM

## 2023-01-20 ENCOUNTER — Other Ambulatory Visit: Payer: Self-pay | Admitting: Family Medicine

## 2023-11-10 ENCOUNTER — Other Ambulatory Visit (HOSPITAL_BASED_OUTPATIENT_CLINIC_OR_DEPARTMENT_OTHER): Payer: Self-pay | Admitting: Physician Assistant

## 2023-11-10 DIAGNOSIS — I25810 Atherosclerosis of coronary artery bypass graft(s) without angina pectoris: Secondary | ICD-10-CM

## 2023-11-18 ENCOUNTER — Telehealth (HOSPITAL_BASED_OUTPATIENT_CLINIC_OR_DEPARTMENT_OTHER): Payer: Self-pay

## 2023-11-24 ENCOUNTER — Inpatient Hospital Stay (HOSPITAL_BASED_OUTPATIENT_CLINIC_OR_DEPARTMENT_OTHER): Admission: RE | Admit: 2023-11-24 | Payer: Self-pay | Source: Ambulatory Visit

## 2023-11-30 ENCOUNTER — Telehealth (HOSPITAL_BASED_OUTPATIENT_CLINIC_OR_DEPARTMENT_OTHER): Payer: Self-pay
